# Patient Record
Sex: Female | Born: 1970
Health system: Southern US, Community
[De-identification: ages and names within clinical notes are randomized; demographics above are authoritative.]

---

## 2000-03-02 ENCOUNTER — Ambulatory Visit (HOSPITAL_COMMUNITY): Admission: RE | Admit: 2000-03-02 | Discharge: 2000-03-02 | Payer: Self-pay | Admitting: Obstetrics

## 2000-04-11 ENCOUNTER — Ambulatory Visit (HOSPITAL_COMMUNITY): Admission: RE | Admit: 2000-04-11 | Discharge: 2000-04-11 | Payer: Self-pay | Admitting: *Deleted

## 2000-05-23 ENCOUNTER — Inpatient Hospital Stay (HOSPITAL_COMMUNITY): Admission: AD | Admit: 2000-05-23 | Discharge: 2000-05-23 | Payer: Self-pay | Admitting: Gynecology

## 2000-07-07 ENCOUNTER — Inpatient Hospital Stay (HOSPITAL_COMMUNITY): Admission: AD | Admit: 2000-07-07 | Discharge: 2000-07-07 | Payer: Self-pay | Admitting: Obstetrics & Gynecology

## 2000-09-09 ENCOUNTER — Inpatient Hospital Stay (HOSPITAL_COMMUNITY): Admission: AD | Admit: 2000-09-09 | Discharge: 2000-09-09 | Payer: Self-pay | Admitting: Obstetrics & Gynecology

## 2000-09-12 ENCOUNTER — Encounter (HOSPITAL_COMMUNITY): Admission: RE | Admit: 2000-09-12 | Discharge: 2000-09-19 | Payer: Self-pay | Admitting: Obstetrics

## 2000-09-15 ENCOUNTER — Encounter: Payer: Self-pay | Admitting: Obstetrics & Gynecology

## 2000-09-15 ENCOUNTER — Inpatient Hospital Stay (HOSPITAL_COMMUNITY): Admission: AD | Admit: 2000-09-15 | Discharge: 2000-09-19 | Payer: Self-pay | Admitting: *Deleted

## 2004-07-29 ENCOUNTER — Ambulatory Visit: Payer: Self-pay | Admitting: Internal Medicine

## 2004-07-29 ENCOUNTER — Ambulatory Visit (HOSPITAL_COMMUNITY): Admission: RE | Admit: 2004-07-29 | Discharge: 2004-07-29 | Payer: Self-pay | Admitting: Internal Medicine

## 2004-07-30 ENCOUNTER — Ambulatory Visit: Payer: Self-pay | Admitting: *Deleted

## 2004-08-12 ENCOUNTER — Ambulatory Visit: Payer: Self-pay | Admitting: Family Medicine

## 2004-08-13 ENCOUNTER — Ambulatory Visit: Payer: Self-pay | Admitting: Internal Medicine

## 2004-09-03 ENCOUNTER — Ambulatory Visit: Payer: Self-pay | Admitting: Internal Medicine

## 2004-10-30 ENCOUNTER — Emergency Department (HOSPITAL_COMMUNITY): Admission: EM | Admit: 2004-10-30 | Discharge: 2004-10-30 | Payer: Self-pay | Admitting: Emergency Medicine

## 2005-01-18 ENCOUNTER — Ambulatory Visit (HOSPITAL_COMMUNITY): Admission: RE | Admit: 2005-01-18 | Discharge: 2005-01-18 | Payer: Self-pay | Admitting: *Deleted

## 2016-04-13 ENCOUNTER — Emergency Department (HOSPITAL_COMMUNITY)
Admission: EM | Admit: 2016-04-13 | Discharge: 2016-04-13 | Disposition: A | Discharge status: Home / Self Care | Payer: Self-pay | Attending: Emergency Medicine | Admitting: Emergency Medicine

## 2016-04-13 ENCOUNTER — Emergency Department (HOSPITAL_COMMUNITY): Payer: MEDICAID

## 2016-04-13 ENCOUNTER — Encounter (HOSPITAL_COMMUNITY): Payer: Self-pay | Admitting: Neurology

## 2016-04-13 DIAGNOSIS — Z3202 Encounter for pregnancy test, result negative: Secondary | ICD-10-CM | POA: Insufficient documentation

## 2016-04-13 DIAGNOSIS — N83201 Unspecified ovarian cyst, right side: Secondary | ICD-10-CM | POA: Insufficient documentation

## 2016-04-13 LAB — CBC WITH DIFFERENTIAL/PLATELET
BASOS ABS: 0 10*3/uL (ref 0.0–0.1)
BASOS PCT: 0 %
EOS ABS: 0.1 10*3/uL (ref 0.0–0.7)
Eosinophils Relative: 1 %
HEMATOCRIT: 33.6 % — AB (ref 36.0–46.0)
HEMOGLOBIN: 10.4 g/dL — AB (ref 12.0–15.0)
Lymphocytes Relative: 26 %
Lymphs Abs: 2.1 10*3/uL (ref 0.7–4.0)
MCH: 24.7 pg — ABNORMAL LOW (ref 26.0–34.0)
MCHC: 31 g/dL (ref 30.0–36.0)
MCV: 79.8 fL (ref 78.0–100.0)
Monocytes Absolute: 0.6 10*3/uL (ref 0.1–1.0)
Monocytes Relative: 7 %
NEUTROS ABS: 5.1 10*3/uL (ref 1.7–7.7)
NEUTROS PCT: 66 %
Platelets: 204 10*3/uL (ref 150–400)
RBC: 4.21 MIL/uL (ref 3.87–5.11)
RDW: 15 % (ref 11.5–15.5)
WBC: 7.8 10*3/uL (ref 4.0–10.5)

## 2016-04-13 LAB — URINALYSIS, ROUTINE W REFLEX MICROSCOPIC
BILIRUBIN URINE: NEGATIVE
GLUCOSE, UA: NEGATIVE mg/dL
Hgb urine dipstick: NEGATIVE
KETONES UR: NEGATIVE mg/dL
Leukocytes, UA: NEGATIVE
NITRITE: NEGATIVE
PROTEIN: NEGATIVE mg/dL
Specific Gravity, Urine: 1.006 (ref 1.005–1.030)
pH: 7 (ref 5.0–8.0)

## 2016-04-13 LAB — COMPREHENSIVE METABOLIC PANEL
ALBUMIN: 3.4 g/dL — AB (ref 3.5–5.0)
ALK PHOS: 64 U/L (ref 38–126)
ALT: 33 U/L (ref 14–54)
ANION GAP: 5 (ref 5–15)
AST: 35 U/L (ref 15–41)
BILIRUBIN TOTAL: 0.6 mg/dL (ref 0.3–1.2)
BUN: 6 mg/dL (ref 6–20)
CALCIUM: 8.9 mg/dL (ref 8.9–10.3)
CO2: 28 mmol/L (ref 22–32)
Chloride: 107 mmol/L (ref 101–111)
Creatinine, Ser: 0.6 mg/dL (ref 0.44–1.00)
GFR calc Af Amer: >60 mL/min (ref >60)
GFR calc non Af Amer: >60 mL/min (ref >60)
GLUCOSE: 109 mg/dL — AB (ref 65–99)
Potassium: 3.7 mmol/L (ref 3.5–5.1)
SODIUM: 140 mmol/L (ref 135–145)
TOTAL PROTEIN: 6.4 g/dL — AB (ref 6.5–8.1)

## 2016-04-13 LAB — WET PREP, GENITAL
Clue Cells Wet Prep HPF POC: NONE SEEN
SPERM: NONE SEEN
Trich, Wet Prep: NONE SEEN
YEAST WET PREP: NONE SEEN

## 2016-04-13 LAB — I-STAT BETA HCG BLOOD, ED (MC, WL, AP ONLY): I-stat hCG, quantitative: <5 m[IU]/mL (ref <5)

## 2016-04-13 LAB — LIPASE, BLOOD: Lipase: 19 U/L (ref 11–51)

## 2016-04-13 MED ORDER — ONDANSETRON HCL 4 MG/2ML IJ SOLN
4.0000 mg | Freq: Once | INTRAMUSCULAR | Status: AC
  Administered 2016-04-13: 4 mg via INTRAVENOUS
  Filled 2016-04-13: qty 2

## 2016-04-13 MED ORDER — ONDANSETRON HCL 4 MG PO TABS: 4.0000 mg | ORAL_TABLET | Freq: Four times a day (QID) | ORAL | Status: DC

## 2016-04-13 MED ORDER — IOPAMIDOL (ISOVUE-300) INJECTION 61%
INTRAVENOUS | Status: AC
Start: 2016-04-13 — End: 2016-04-13
  Administered 2016-04-13: 100 mL
  Filled 2016-04-13: qty 100

## 2016-04-13 MED ORDER — LORAZEPAM 2 MG/ML IJ SOLN
1.0000 mg | Freq: Once | INTRAMUSCULAR | Status: AC
  Administered 2016-04-13: 1 mg via INTRAVENOUS
  Filled 2016-04-13: qty 1

## 2016-04-13 MED ORDER — HYDROMORPHONE HCL 1 MG/ML IJ SOLN
1.0000 mg | Freq: Once | INTRAMUSCULAR | Status: AC
  Administered 2016-04-13: 1 mg via INTRAVENOUS
  Filled 2016-04-13: qty 1

## 2016-04-13 MED ORDER — HYDROCODONE-ACETAMINOPHEN 5-325 MG PO TABS: 1.0000 | ORAL_TABLET | ORAL | Status: DC | PRN

## 2016-04-13 MED ORDER — MORPHINE SULFATE (PF) 4 MG/ML IV SOLN
4.0000 mg | Freq: Once | INTRAVENOUS | Status: AC
  Administered 2016-04-13: 4 mg via INTRAVENOUS
  Filled 2016-04-13: qty 1

## 2016-04-13 NOTE — Discharge Instructions (Signed)
Ovarian Cyst An ovarian cyst is a fluid-filled sac that forms on an ovary. The ovaries are small organs that produce eggs in women. Various types of cysts can form on the ovaries. Most are not cancerous. Many do not cause problems, and they often go away on their own. Some may cause symptoms and require treatment. Common types of ovarian cysts include:  Functional cysts--These cysts may occur every month during the menstrual cycle. This is normal. The cysts usually go away with the next menstrual cycle if the woman does not get pregnant. Usually, there are no symptoms with a functional cyst.  Endometrioma cysts--These cysts form from the tissue that lines the uterus. They are also called "chocolate cysts" because they become filled with blood that turns brown. This type of cyst can cause pain in the lower abdomen during intercourse and with your menstrual period.  Cystadenoma cysts--This type develops from the cells on the outside of the ovary. These cysts can get very big and cause lower abdomen pain and pain with intercourse. This type of cyst can twist on itself, cut off its blood supply, and cause severe pain. It can also easily rupture and cause a lot of pain.  Dermoid cysts--This type of cyst is sometimes found in both ovaries. These cysts may contain different kinds of body tissue, such as skin, teeth, hair, or cartilage. They usually do not cause symptoms unless they get very big.  Theca lutein cysts--These cysts occur when too much of a certain hormone (human chorionic gonadotropin) is produced and overstimulates the ovaries to produce an egg. This is most common after procedures used to assist with the conception of a baby (in vitro fertilization). CAUSES   Fertility drugs can cause a condition in which multiple large cysts are formed on the ovaries. This is called ovarian hyperstimulation syndrome.  A condition called polycystic ovary syndrome can cause hormonal imbalances that can lead to  nonfunctional ovarian cysts. SIGNS AND SYMPTOMS  Many ovarian cysts do not cause symptoms. If symptoms are present, they may include:  Pelvic pain or pressure.  Pain in the lower abdomen.  Pain during sexual intercourse.  Increasing girth (swelling) of the abdomen.  Abnormal menstrual periods.  Increasing pain with menstrual periods.  Stopping having menstrual periods without being pregnant. DIAGNOSIS  These cysts are commonly found during a routine or annual pelvic exam. Tests may be ordered to find out more about the cyst. These tests may include:  Ultrasound.  X-ray of the pelvis.  CT scan.  MRI.  Blood tests. TREATMENT  Many ovarian cysts go away on their own without treatment. Your health care provider may want to check your cyst regularly for 2-3 months to see if it changes. For women in menopause, it is particularly important to monitor a cyst closely because of the higher rate of ovarian cancer in menopausal women. When treatment is needed, it may include any of the following:  A procedure to drain the cyst (aspiration). This may be done using a long needle and ultrasound. It can also be done through a laparoscopic procedure. This involves using a thin, lighted tube with a tiny camera on the end (laparoscope) inserted through a small incision.  Surgery to remove the whole cyst. This may be done using laparoscopic surgery or an open surgery involving a larger incision in the lower abdomen.  Hormone treatment or birth control pills. These methods are sometimes used to help dissolve a cyst. HOME CARE INSTRUCTIONS   Only take over-the-counter   or prescription medicines as directed by your health care provider.  Follow up with your health care provider as directed.  Get regular pelvic exams and Pap tests. SEEK MEDICAL CARE IF:   Your periods are late, irregular, or painful, or they stop.  Your pelvic pain or abdominal pain does not go away.  Your abdomen becomes  larger or swollen.  You have pressure on your bladder or trouble emptying your bladder completely.  You have pain during sexual intercourse.  You have feelings of fullness, pressure, or discomfort in your stomach.  You lose weight for no apparent reason.  You feel generally ill.  You become constipated.  You lose your appetite.  You develop acne.  You have an increase in body and facial hair.  You are gaining weight, without changing your exercise and eating habits.  You think you are pregnant. SEEK IMMEDIATE MEDICAL CARE IF:   You have increasing abdominal pain.  You feel sick to your stomach (nauseous), and you throw up (vomit).  You develop a fever that comes on suddenly.  You have abdominal pain during a bowel movement.  Your menstrual periods become heavier than usual. MAKE SURE YOU:  Understand these instructions.  Will watch your condition.  Will get help right away if you are not doing well or get worse.   This information is not intended to replace advice given to you by your health care provider. Make sure you discuss any questions you have with your health care provider.   Document Released: 11/08/2005 Document Revised: 11/13/2013 Document Reviewed: 07/16/2013 Elsevier Interactive Patient Education 2016 [employer]  

## 2016-04-13 NOTE — ED Notes (Signed)
In to discharge pt, is vomiting again. PA made aware.

## 2016-04-13 NOTE — ED Notes (Signed)
Pt is spanish speaking here with her son, reports RLQ abd pain since yesterday. Vomiting x 1 this morning.

## 2016-04-13 NOTE — ED Notes (Signed)
At discharge pt vomiting, PA made aware. Pt cleared for d/c. Needs to get zofran filled. Instructions reviewed with her son and pt. Verbalized understanding. Told to return if sx worsen or concerning sx. Taken to lobby in w/c. They were calling a friend to come get them.

## 2016-04-13 NOTE — Progress Notes (Signed)
Spoke to patients son in regards to follow up care for his mother. [employer] card application provided in spanish for mother to establish primary care. My contact information provided for any future questions or concerns. No other [facility]ligibility Specialist needs identified at this time.   Buddy DutyFelicia Evans [id number] [phone]

## 2016-04-13 NOTE — ED Provider Notes (Signed)
CSN: [id number]     Arrival date & time 04/13/16  0865 History   First MD Initiated Contact with Patient 04/13/16 0815     Chief Complaint  Patient presents with  . Abdominal Pain     (Consider location/radiation/quality/duration/timing/severity/associated sxs/prior Treatment) HPI Comments: Patient presents to the emergency department with chief complaint of right lower quadrant pain. She states that the pain started yesterday morning. It is been progressively worsening. She reports associated vomiting. Denies any diarrhea or vaginal discharge. Denies any dysuria. No known fevers. Her symptoms are worsened with palpation. Prior abdominal surgeries include previous C section.  The history is provided by the patient. No language interpreter was used.    History reviewed. No pertinent past medical history. Past Surgical History  Procedure Laterality Date  . Cesarean section     No family history on file. Social History  Substance Use Topics  . Smoking status: Never Smoker   . Smokeless tobacco: None  . Alcohol Use: No   OB History    No data available     Review of Systems  Constitutional: Negative for fever and chills.  Respiratory: Negative for shortness of breath.   Cardiovascular: Negative for chest pain.  Gastrointestinal: Positive for abdominal pain. Negative for nausea, vomiting, diarrhea and constipation.  Genitourinary: Negative for dysuria.  All other systems reviewed and are negative.     Allergies  Review of patient's allergies indicates no known allergies.  Home Medications   Prior to Admission medications   Not on File   BP 109/74 mmHg  Pulse 98  Temp(Src) 98.1 F (36.7 C) (Oral)  Resp 14  SpO2 97%  LMP 03/27/2016 Physical Exam  Constitutional: She is oriented to person, place, and time. She appears well-developed and well-nourished.  HENT:  Head: Normocephalic and atraumatic.  Eyes: Conjunctivae and EOM are normal. Pupils are equal, round, and  reactive to light.  Neck: Normal range of motion. Neck supple.  Cardiovascular: Normal rate and regular rhythm.  Exam reveals no gallop and no friction rub.   No murmur heard. Pulmonary/Chest: Effort normal and breath sounds normal. No respiratory distress. She has no wheezes. She has no rales. She exhibits no tenderness.  Abdominal: Soft. Bowel sounds are normal. She exhibits no distension and no mass. There is no tenderness. There is no rebound and no guarding.  RLQ tender to palpation, no other focal abdominal tenderness  Genitourinary:  Pelvic exam performed by PA student under my direction, chaperoned by female ER tech, no right or left adnexal tenderness, no uterine tenderness, mild white vaginal discharge, no bleeding, no CMT or friability, no foreign body, no injury to the external genitalia, no other significant findings   Musculoskeletal: Normal range of motion. She exhibits no edema or tenderness.  Neurological: She is alert and oriented to person, place, and time.  Skin: Skin is warm and dry.  Psychiatric: She has a normal mood and affect. Her behavior is normal. Judgment and thought content normal.  Nursing note and vitals reviewed.   ED Course  Procedures (including critical care time) Results for orders placed or performed during the hospital encounter of 04/13/16  Wet prep, genital  Result Value Ref Range   Yeast Wet Prep HPF POC NONE SEEN NONE SEEN   Trich, Wet Prep NONE SEEN NONE SEEN   Clue Cells Wet Prep HPF POC NONE SEEN NONE SEEN   WBC, Wet Prep HPF POC MODERATE (A) NONE SEEN   Sperm NONE SEEN   CBC with  Differential/Platelet  Result Value Ref Range   WBC 7.8 4.0 - 10.5 K/uL   RBC 4.21 3.87 - 5.11 MIL/uL   Hemoglobin 10.4 (L) 12.0 - 15.0 g/dL   HCT 95.2 (L) 84.1 - 32.4 %   MCV 79.8 78.0 - 100.0 fL   MCH 24.7 (L) 26.0 - 34.0 pg   MCHC 31.0 30.0 - 36.0 g/dL   RDW 40.1 02.7 - 25.3 %   Platelets 204 150 - 400 K/uL   Neutrophils Relative % 66 %   Neutro Abs 5.1  1.7 - 7.7 K/uL   Lymphocytes Relative 26 %   Lymphs Abs 2.1 0.7 - 4.0 K/uL   Monocytes Relative 7 %   Monocytes Absolute 0.6 0.1 - 1.0 K/uL   Eosinophils Relative 1 %   Eosinophils Absolute 0.1 0.0 - 0.7 K/uL   Basophils Relative 0 %   Basophils Absolute 0.0 0.0 - 0.1 K/uL  Comprehensive metabolic panel  Result Value Ref Range   Sodium 140 135 - 145 mmol/L   Potassium 3.7 3.5 - 5.1 mmol/L   Chloride 107 101 - 111 mmol/L   CO2 28 22 - 32 mmol/L   Glucose, Bld 109 (H) 65 - 99 mg/dL   BUN 6 6 - 20 mg/dL   Creatinine, Ser 6.64 0.44 - 1.00 mg/dL   Calcium 8.9 8.9 - 40.3 mg/dL   Total Protein 6.4 (L) 6.5 - 8.1 g/dL   Albumin 3.4 (L) 3.5 - 5.0 g/dL   AST 35 15 - 41 U/L   ALT 33 14 - 54 U/L   Alkaline Phosphatase 64 38 - 126 U/L   Total Bilirubin 0.6 0.3 - 1.2 mg/dL   GFR calc non Af Amer >60 >60 mL/min   GFR calc Af Amer >60 >60 mL/min   Anion gap 5 5 - 15  Lipase, blood  Result Value Ref Range   Lipase 19 11 - 51 U/L  Urinalysis, Routine w reflex microscopic (not at [facility])  Result Value Ref Range   Color, Urine YELLOW YELLOW   APPearance CLEAR CLEAR   Specific Gravity, Urine 1.006 1.005 - 1.030   pH 7.0 5.0 - 8.0   Glucose, UA NEGATIVE NEGATIVE mg/dL   Hgb urine dipstick NEGATIVE NEGATIVE   Bilirubin Urine NEGATIVE NEGATIVE   Ketones, ur NEGATIVE NEGATIVE mg/dL   Protein, ur NEGATIVE NEGATIVE mg/dL   Nitrite NEGATIVE NEGATIVE   Leukocytes, UA NEGATIVE NEGATIVE  I-Stat Beta hCG blood, ED ([facility], [facility], [facility] only)  Result Value Ref Range   I-stat hCG, quantitative <5.0 <5 mIU/mL   Comment 3           Ct Abdomen Pelvis W Contrast  04/13/2016  CLINICAL DATA:  RIGHT lower quadrant pain with nausea and vomiting since yesterday, negative pregnancy test EXAM: CT ABDOMEN AND PELVIS WITH CONTRAST TECHNIQUE: Multidetector CT imaging of the abdomen and pelvis was performed using the standard protocol following bolus administration of intravenous contrast. Sagittal and coronal MPR images  reconstructed from axial data set. CONTRAST:  ISOVUE-300 IOPAMIDOL (ISOVUE-300) INJECTION 61% IV. No oral contrast administered. COMPARISON:  None FINDINGS: Lower chest:  Mild bibasilar atelectasis. Hepatobiliary: Liver and gallbladder normal appearance. No biliary dilatation. Pancreas: Normal appearance Spleen: Normal appearance Adrenals/Urinary Tract: Adrenal glands normal appearance. Symmetric nephrograms with small RIGHT renal cysts noted. No additional renal mass, hydronephrosis or urinary tract calcification. Unremarkable ureters. Minimal wall thickening of the anterior bladder versus artifact from underdistention. Stomach/Bowel: Normal appendix. Stomach and bowel loops normal appearance. No  bowel dilatation or obvious wall thickening. Vascular/Lymphatic: Major vascular structures patent. No abdominal or pelvic adenopathy. Reproductive: Normal appearing uterus. Small BILATERAL ovarian cysts larger on RIGHT at 2.2 x 1.7 cm image 69. Other: No mass, hernia, free air, or free fluid. Musculoskeletal: Unremarkable IMPRESSION: No acute intra-abdominal or intrapelvic abnormalities. Small BILATERAL ovarian cysts larger on RIGHT. Electronically Signed   By: Ulyses SouthwardMark  Boles M.D.   On: 04/13/2016 10:24    I have personally reviewed and evaluated these images and lab results as part of my medical decision-making.    MDM   Final diagnoses:  Cyst of right ovary    Patient with gradually worsening right lower quadrant pain since yesterday. She reports a few episodes of vomiting. She denies any fever. Will check CT to rule out appendicitis.  I-STAT hCG is negative, negative urinalysis, no leukocytosis, patient is afebrile and vital signs are stable.  CT scan is consistent with right-sided ovarian cyst. I suspect this to be the cause of patient's pain. I doubt ovarian torsion, as the patient's pain was gradual in onset since yesterday. She has a normal appendix on CT. There is no other evidence of infection  or acute process. Will treat patient's pain, and recommend close follow-up with OB/GYN. Return precautions discussed. Patient understands and agrees the plan. She is stable and ready for discharge.    Roxy HorsemanRobert Johntay Doolen, PA-C 04/13/16 1213  Gwyneth SproutWhitney Plunkett, MD 04/13/16 [phone]

## 2016-04-14 LAB — GC/CHLAMYDIA PROBE AMP (~~LOC~~) NOT AT ARMC
CHLAMYDIA, DNA PROBE: NEGATIVE
Neisseria Gonorrhea: NEGATIVE

## 2016-04-16 ENCOUNTER — Encounter (HOSPITAL_COMMUNITY): Payer: Self-pay | Admitting: Emergency Medicine

## 2016-04-16 ENCOUNTER — Emergency Department (HOSPITAL_COMMUNITY)
Admission: EM | Admit: 2016-04-16 | Discharge: 2016-04-17 | Disposition: A | Discharge status: Home / Self Care | Payer: Self-pay | Attending: Emergency Medicine | Admitting: Emergency Medicine

## 2016-04-16 DIAGNOSIS — R112 Nausea with vomiting, unspecified: Secondary | ICD-10-CM | POA: Insufficient documentation

## 2016-04-16 DIAGNOSIS — R102 Pelvic and perineal pain: Secondary | ICD-10-CM

## 2016-04-16 DIAGNOSIS — Z3202 Encounter for pregnancy test, result negative: Secondary | ICD-10-CM | POA: Insufficient documentation

## 2016-04-16 DIAGNOSIS — R630 Anorexia: Secondary | ICD-10-CM | POA: Insufficient documentation

## 2016-04-16 DIAGNOSIS — N83201 Unspecified ovarian cyst, right side: Secondary | ICD-10-CM | POA: Insufficient documentation

## 2016-04-16 DIAGNOSIS — K5900 Constipation, unspecified: Secondary | ICD-10-CM | POA: Insufficient documentation

## 2016-04-16 DIAGNOSIS — Z9049 Acquired absence of other specified parts of digestive tract: Secondary | ICD-10-CM | POA: Insufficient documentation

## 2016-04-16 LAB — COMPREHENSIVE METABOLIC PANEL
ALBUMIN: 3.6 g/dL (ref 3.5–5.0)
ALK PHOS: 61 U/L (ref 38–126)
ALT: 43 U/L (ref 14–54)
ANION GAP: 6 (ref 5–15)
AST: 33 U/L (ref 15–41)
BILIRUBIN TOTAL: 0.5 mg/dL (ref 0.3–1.2)
BUN: 9 mg/dL (ref 6–20)
CALCIUM: 9.1 mg/dL (ref 8.9–10.3)
CO2: 26 mmol/L (ref 22–32)
Chloride: 106 mmol/L (ref 101–111)
Creatinine, Ser: 0.67 mg/dL (ref 0.44–1.00)
GLUCOSE: 114 mg/dL — AB (ref 65–99)
POTASSIUM: 3.9 mmol/L (ref 3.5–5.1)
Sodium: 138 mmol/L (ref 135–145)
TOTAL PROTEIN: 7.1 g/dL (ref 6.5–8.1)

## 2016-04-16 LAB — URINALYSIS, ROUTINE W REFLEX MICROSCOPIC
BILIRUBIN URINE: NEGATIVE
Glucose, UA: NEGATIVE mg/dL
Hgb urine dipstick: NEGATIVE
Ketones, ur: NEGATIVE mg/dL
LEUKOCYTES UA: NEGATIVE
NITRITE: NEGATIVE
PH: 6 (ref 5.0–8.0)
Protein, ur: NEGATIVE mg/dL
SPECIFIC GRAVITY, URINE: 1.01 (ref 1.005–1.030)

## 2016-04-16 LAB — CBC
HEMATOCRIT: 33 % — AB (ref 36.0–46.0)
HEMOGLOBIN: 10.3 g/dL — AB (ref 12.0–15.0)
MCH: 24.2 pg — ABNORMAL LOW (ref 26.0–34.0)
MCHC: 31.2 g/dL (ref 30.0–36.0)
MCV: 77.6 fL — ABNORMAL LOW (ref 78.0–100.0)
Platelets: 244 10*3/uL (ref 150–400)
RBC: 4.25 MIL/uL (ref 3.87–5.11)
RDW: 14.7 % (ref 11.5–15.5)
WBC: 6.5 10*3/uL (ref 4.0–10.5)

## 2016-04-16 LAB — POC URINE PREG, ED: PREG TEST UR: NEGATIVE

## 2016-04-16 LAB — LIPASE, BLOOD: Lipase: 15 U/L (ref 11–51)

## 2016-04-16 MED ORDER — MORPHINE SULFATE (PF) 4 MG/ML IV SOLN
4.0000 mg | Freq: Once | INTRAVENOUS | Status: AC
  Administered 2016-04-16: 4 mg via INTRAVENOUS
  Filled 2016-04-16: qty 1

## 2016-04-16 MED ORDER — ONDANSETRON HCL 4 MG/2ML IJ SOLN
4.0000 mg | Freq: Once | INTRAMUSCULAR | Status: AC
  Administered 2016-04-16: 4 mg via INTRAVENOUS
  Filled 2016-04-16: qty 2

## 2016-04-16 NOTE — ED Notes (Signed)
In to room by w/c, alert, NAD, calm, no dyspnea noted.

## 2016-04-16 NOTE — ED Provider Notes (Signed)
CSN: [id number]     Arrival date & time 04/16/16  1912 History  By signing my name below, I, Ronney Lion, attest that this documentation has been prepared under the direction and in the presence of Derwood Kaplan, MD. Electronically Signed: Ronney Lion, ED Scribe. 04/16/2016. 3:25 AM.    Chief Complaint  Patient presents with  . Abdominal Pain   The history is provided by the patient and medical records. A language interpreter was used (Patient's son acted as Equities trader.).    HPI Comments: Jenna Wolfe is a 45 y.o. female with a history of Cesarean section (no other abdominal surgeries, per pt), who presents to the Emergency Department complaining constant, waxing and waning episodes of RLQ abdominal pain that intermittently peak in severity for 2 minutes at a time, with onset 4 days ago. Patient characterizes the pain as "like a needle poking her" in quality and states it radiates down to her right thigh. Patient also complains of associated nausea with "too many" episodes of vomiting this morning, loss of appetite, and constipation for the past 4 days. She states she has been passing gas. She reports she sometimes walks with an antalgic gait, secondary to her abdominal pain. Per chart review, patient was evaluated in the ED for the RLQ pain 3 days ago, on 04/13/16. A CT abdomen was done at that time and showed a normal appendix and "Small BILATERAL ovarian cysts, larger on RIGHT at 2.2 x 1.7 cm." Patient states this is a new problem. She denies a history of DM. Patient reports a history of Cesarean section but otherwise denies a history of abdominal surgeries. She denies diarrhea or dysuria.  No past medical history on file. Past Surgical History  Procedure Laterality Date  . Cesarean section     No family history on file. Social History  Substance Use Topics  . Smoking status: Never Smoker   . Smokeless tobacco: Not on file  . Alcohol Use: No   OB History    No data available      Review of Systems  Constitutional: Positive for appetite change.  Gastrointestinal: Positive for nausea, vomiting, abdominal pain and constipation. Negative for diarrhea.  Genitourinary: Negative for dysuria.   Allergies  Review of patient's allergies indicates no known allergies.  Home Medications   Prior to Admission medications   Medication Sig Start Date End Date Taking? Authorizing Provider  HYDROcodone-acetaminophen (NORCO/VICODIN) 5-325 MG tablet Take 1-2 tablets by mouth every 4 (four) hours as needed. 04/17/16   Derwood Kaplan, MD  ondansetron (ZOFRAN) 4 MG tablet Take 1 tablet (4 mg total) by mouth every 6 (six) hours. 04/17/16   Cauy Melody, MD   BP 105/68 mmHg  Pulse 75  Temp(Src) 98 F (36.7 C) (Oral)  Resp 18  Wt 130 lb (58.968 kg)  SpO2 99%  LMP 03/27/2016 Physical Exam  Constitutional: She is oriented to person, place, and time. She appears well-developed and well-nourished. No distress.  HENT:  Head: Normocephalic and atraumatic.  Eyes: Conjunctivae and EOM are normal.  Neck: Neck supple. No tracheal deviation present.  Cardiovascular: Normal rate and regular rhythm.   Pulmonary/Chest: Effort normal and breath sounds normal. No respiratory distress. She has no wheezes. She has no rales.  Lungs are clear to auscultation.   Abdominal: There is tenderness. There is tenderness at McBurney's point.  Positive RLQ tenderness. Positive McBurney's.   Musculoskeletal: Normal range of motion.  Neurological: She is alert and oriented to person, place, and time.  Skin:  Skin is warm and dry.  Psychiatric: She has a normal mood and affect. Her behavior is normal.  Nursing note and vitals reviewed.   ED Course  Procedures (including critical care time)  DIAGNOSTIC STUDIES: Oxygen Saturation is 100% on RA, normal by my interpretation.    COORDINATION OF CARE: 11:34 PM - Discussed treatment plan with pt and her son at bedside which includes lab tests, including  ultrasound and pain medications administered here. If ultrasound is unremarkable, will proceed with pelvic exam. Pt and her son verbalized understanding and agreed to plan.   3:23 AM - Reassessed pt. We have reviewed lab and imaging results with pt, via Spanish interpreter. Informed pt that she needs to see a gynecologist as soon as possible. Strict ER return precautions have been discussed for worsening symptoms.   Labs Review Labs Reviewed  COMPREHENSIVE METABOLIC PANEL - Abnormal; Notable for the following:    Glucose, Bld 114 (*)    All other components within normal limits  CBC - Abnormal; Notable for the following:    Hemoglobin 10.3 (*)    HCT 33.0 (*)    MCV 77.6 (*)    MCH 24.2 (*)    All other components within normal limits  LIPASE, BLOOD  URINALYSIS, ROUTINE W REFLEX MICROSCOPIC (NOT AT [facility])  POC URINE PREG, ED    Imaging Review Koreas Transvaginal Non-ob  04/17/2016  CLINICAL DATA:  Right lower quadrant pain for 4 days, progressive. EXAM: TRANSABDOMINAL AND TRANSVAGINAL ULTRASOUND OF PELVIS DOPPLER ULTRASOUND OF OVARIES TECHNIQUE: Both transabdominal and transvaginal ultrasound examinations of the pelvis were performed. Transabdominal technique was performed for global imaging of the pelvis including uterus, ovaries, adnexal regions, and pelvic cul-de-sac. It was necessary to proceed with endovaginal exam following the transabdominal exam to visualize the right ovary. Color and duplex Doppler ultrasound was utilized to evaluate blood flow to the ovaries. COMPARISON:  CT abdomen/ pelvis 04/13/2016 FINDINGS: Uterus Measurements: 10.9 x 4.1 x 5.2 cm. No fibroids or other mass visualized. Endometrium Thickness: 16 mm. No focal abnormality visualized, however appears mildly heterogeneous. Right ovary Measurements: 2.7 x 2.5 x 3.0 cm. There is a complex 2.1 x 2.1 x 2.2 cm cyst. This as lacy low-level internal echoes. Blood flow seen to the ovarian parenchyma. Left ovary Measurements: 1.6 x  2.4 x 1.7 cm. Follicular cyst measures 2.1 x 1.1 x 1.2 cm. No adnexal mass. Normal blood flow. Pulsed Doppler evaluation of both ovaries demonstrates normal low-resistance arterial and venous waveforms. Other findings Trace free fluid, physiologic. IMPRESSION: 1. Complex cyst in the right ovary measuring 2.1 cm has the appearance of a hemorrhagic cyst. No torsion, normal blood flow is seen. 2. Simple follicular cyst in the left ovary. 3. Upper normal endometrial thickness measuring 16 mm, with heterogeneity. This may be normal for phase of menstrual cycle. However, if there is a history of abnormal vaginal bleeding that remains unresponsive to hormonal or medical therapy, focal lesion work-up with sonohysterogram should be considered. Endometrial biopsy should also be considered in pre-menopausal patients at high risk for endometrial carcinoma. (Ref: Radiological Reasoning: Algorithmic Workup of Abnormal Vaginal Bleeding with Endovaginal Sonography and Sonohysterography. AJR 2008; 308:M57-84; 191:[id number]) Electronically Signed   By: Rubye OaksMelanie  Ehinger M.D.   On: 04/17/2016 01:24   [place] Pelvis Complete  04/17/2016  CLINICAL DATA:  Right lower quadrant pain for 4 days, progressive. EXAM: TRANSABDOMINAL AND TRANSVAGINAL ULTRASOUND OF PELVIS DOPPLER ULTRASOUND OF OVARIES TECHNIQUE: Both transabdominal and transvaginal ultrasound examinations of the pelvis were performed. Transabdominal  technique was performed for global imaging of the pelvis including uterus, ovaries, adnexal regions, and pelvic cul-de-sac. It was necessary to proceed with endovaginal exam following the transabdominal exam to visualize the right ovary. Color and duplex Doppler ultrasound was utilized to evaluate blood flow to the ovaries. COMPARISON:  CT abdomen/ pelvis 04/13/2016 FINDINGS: Uterus Measurements: 10.9 x 4.1 x 5.2 cm. No fibroids or other mass visualized. Endometrium Thickness: 16 mm. No focal abnormality visualized, however appears mildly  heterogeneous. Right ovary Measurements: 2.7 x 2.5 x 3.0 cm. There is a complex 2.1 x 2.1 x 2.2 cm cyst. This as lacy low-level internal echoes. Blood flow seen to the ovarian parenchyma. Left ovary Measurements: 1.6 x 2.4 x 1.7 cm. Follicular cyst measures 2.1 x 1.1 x 1.2 cm. No adnexal mass. Normal blood flow. Pulsed Doppler evaluation of both ovaries demonstrates normal low-resistance arterial and venous waveforms. Other findings Trace free fluid, physiologic. IMPRESSION: 1. Complex cyst in the right ovary measuring 2.1 cm has the appearance of a hemorrhagic cyst. No torsion, normal blood flow is seen. 2. Simple follicular cyst in the left ovary. 3. Upper normal endometrial thickness measuring 16 mm, with heterogeneity. This may be normal for phase of menstrual cycle. However, if there is a history of abnormal vaginal bleeding that remains unresponsive to hormonal or medical therapy, focal lesion work-up with sonohysterogram should be considered. Endometrial biopsy should also be considered in pre-menopausal patients at high risk for endometrial carcinoma. (Ref: Radiological Reasoning: Algorithmic Workup of Abnormal Vaginal Bleeding with Endovaginal Sonography and Sonohysterography. AJR 2008; 960:A54-09) Electronically Signed   By: Rubye Oaks M.D.   On: 04/17/2016 01:24   [place] Art/ven Flow Abd Pelv Doppler  04/17/2016  CLINICAL DATA:  Right lower quadrant pain for 4 days, progressive. EXAM: TRANSABDOMINAL AND TRANSVAGINAL ULTRASOUND OF PELVIS DOPPLER ULTRASOUND OF OVARIES TECHNIQUE: Both transabdominal and transvaginal ultrasound examinations of the pelvis were performed. Transabdominal technique was performed for global imaging of the pelvis including uterus, ovaries, adnexal regions, and pelvic cul-de-sac. It was necessary to proceed with endovaginal exam following the transabdominal exam to visualize the right ovary. Color and duplex Doppler ultrasound was utilized to evaluate blood flow to the ovaries.  COMPARISON:  CT abdomen/ pelvis 04/13/2016 FINDINGS: Uterus Measurements: 10.9 x 4.1 x 5.2 cm. No fibroids or other mass visualized. Endometrium Thickness: 16 mm. No focal abnormality visualized, however appears mildly heterogeneous. Right ovary Measurements: 2.7 x 2.5 x 3.0 cm. There is a complex 2.1 x 2.1 x 2.2 cm cyst. This as lacy low-level internal echoes. Blood flow seen to the ovarian parenchyma. Left ovary Measurements: 1.6 x 2.4 x 1.7 cm. Follicular cyst measures 2.1 x 1.1 x 1.2 cm. No adnexal mass. Normal blood flow. Pulsed Doppler evaluation of both ovaries demonstrates normal low-resistance arterial and venous waveforms. Other findings Trace free fluid, physiologic. IMPRESSION: 1. Complex cyst in the right ovary measuring 2.1 cm has the appearance of a hemorrhagic cyst. No torsion, normal blood flow is seen. 2. Simple follicular cyst in the left ovary. 3. Upper normal endometrial thickness measuring 16 mm, with heterogeneity. This may be normal for phase of menstrual cycle. However, if there is a history of abnormal vaginal bleeding that remains unresponsive to hormonal or medical therapy, focal lesion work-up with sonohysterogram should be considered. Endometrial biopsy should also be considered in pre-menopausal patients at high risk for endometrial carcinoma. (Ref: Radiological Reasoning: Algorithmic Workup of Abnormal Vaginal Bleeding with Endovaginal Sonography and Sonohysterography. AJR 2008; [id number]:[id number]) Electronically Signed  By: Rubye Oaks M.D.   On: 04/17/2016 01:24   I have personally reviewed and evaluated these images and lab results as part of my medical decision-making.  MDM   Final diagnoses:  Pelvic pain in female  Cyst of right ovary    I personally performed the services described in this documentation, which was scribed in my presence. The recorded information has been reviewed and is accurate.  Pt comes in with RLQ abd pain. Pain present for a week now. She had a  CT scan done few days back, and it was neg for appendicitis. Small ovarian cyst seen. Pt has hx of csection, no other significant surgical hx. She has no significant medical hx either.  Pt has some nausea with the pain. Her pain is constant at baseline, with waxing and waning. No specific aggravating factors. On exam, VSS and WNL. RLQ tenderness, focal. Pt denies any uti like sx, vaginal bleeding or discharge. US pelvic ordered - r/o torsion, abscess, free fluid. Ovary is complex. Utilized a Nurse, learning disability to discuss the ultrasound findings, specfically the complex cyst. Strongly advised to see Gyne, and pt has no Gyne, so we have provided [facility], so that pt's uterine abnormality can also be addressed. Currently, no night sweats, significant weight loss. All the labs are reassuring. We dont feel CT is indicated in the Er for this visit given the neg labs, and neg recent CT.    Derwood Kaplan, MD 04/17/16 2348

## 2016-04-16 NOTE — ED Notes (Signed)
EDP into room, at BS.  ?

## 2016-04-16 NOTE — ED Notes (Signed)
Pt here with RLQ abd pain. Pt was seen here for same on Tuesday and told to return if worsening. Pt also reports continued nausea and vomiting. Denies diarrhea.

## 2016-04-17 ENCOUNTER — Emergency Department (HOSPITAL_COMMUNITY): Payer: MEDICAID

## 2016-04-17 MED ORDER — HYDROCODONE-ACETAMINOPHEN 5-325 MG PO TABS: 1.0000 | ORAL_TABLET | ORAL | Status: DC | PRN

## 2016-04-17 MED ORDER — ONDANSETRON HCL 4 MG PO TABS: 4.0000 mg | ORAL_TABLET | Freq: Four times a day (QID) | ORAL | Status: DC

## 2016-04-17 NOTE — ED Notes (Signed)
Not in room, remains in US.

## 2016-04-17 NOTE — Discharge Instructions (Signed)
We saw you in the ER for the abdominal pain. All of our results are normal, including all labs and imaging. Kidney function is fine as well. We are not sure what is causing your abdominal pain, and recommend that you see your primary care doctor within 2-3 days for further evaluation. If your symptoms get worse, return to the ER. Take the pain meds and nausea meds as prescribed.   Dolor abdominal en adultos (Abdominal Pain, Adult) El dolor puede tener muchas causas. Normalmente la causa del dolor abdominal no es una enfermedad y Scientist, clinical (histocompatibility and immunogenetics)mejorar sin TEFL teachertratamiento. Frecuentemente puede controlarse y tratarse en casa. Su mdico le Medical sales representativerealizar un examen fsico y posiblemente solicite anlisis de sangre y radiografas para ayudar a Chief Strategy Officerdeterminar la gravedad de su dolor. Sin embargo, en IAC/InterActiveCorpmuchos casos, debe transcurrir ms tiempo antes de que se pueda Clinical research associateencontrar una causa evidente del dolor. Antes de llegar a ese punto, es posible que su mdico no sepa si necesita ms pruebas o un tratamiento ms profundo. INSTRUCCIONES PARA EL CUIDADO EN EL HOGAR  Est atento al dolor para ver si hay cambios. Las siguientes indicaciones ayudarn a Architectural technologistaliviar cualquier molestia que pueda sentir:  [place] solo medicamentos de venta libre o recetados, segn las indicaciones del mdico.  No tome laxantes a menos que se lo haya indicado su mdico.  Pruebe con Neomia Dearuna dieta lquida absoluta (caldo, t o agua) segn se lo indique su mdico. Introduzca gradualmente una dieta normal, segn su tolerancia. SOLICITE ATENCIN MDICA SI:  Tiene dolor abdominal sin explicacin.  Tiene dolor abdominal relacionado con nuseas o diarrea.  Tiene dolor cuando orina o defeca.  Experimenta dolor abdominal que lo despierta de noche.  Tiene dolor abdominal que empeora o mejora cuando come alimentos.  Tiene dolor abdominal que empeora cuando come alimentos grasosos.  Tiene fiebre. SOLICITE ATENCIN MDICA DE INMEDIATO SI:   El dolor no desaparece en un  plazo mximo de 2horas.  No deja de (vomitar).  El Engineer, miningdolor se siente solo en partes del abdomen, como el lado derecho o la parte inferior izquierda del abdomen.  Evaca materia fecal sanguinolenta o negra, de aspecto alquitranado. ASEGRESE DE QUE:  Comprende estas instrucciones.  Controlar su afeccin.  Recibir ayuda de inmediato si no mejora o si empeora.   Esta informacin no tiene Theme park managercomo fin reemplazar el consejo del mdico. Asegrese de hacerle al mdico cualquier pregunta que tenga.   Document Released: 11/08/2005 Document Revised: 11/29/2014 Elsevier Interactive Patient Education 2016 [employer]  Dolor plvico, mujeres (Pelvic Pain, Female)  Las causa del dolor plvico en la mujer pueden ser muchas y pueden tener su origen en diferentes lugares. El dolor plvico es el que aparece en la mitad inferior del abdomen y [place] Samos[place]entre las caderas. Puede aparecer durante en un perodo corto de tiempo (agudo)o puede ser recurrente (crnico). Esta afeccin puede estar relacionada con trastornos que afectan a los rganos reproductivos femeninos (ginecolgica), pero tambin puede deberse a problemas en la vejiga, clculos renales, complicaciones intestinales, o problemas musculares o esquelticos. Es Garment/textile technologistimportante solicitar ayuda de inmediato, sobre todo si ha sido intenso, [place]udo, o ha aparecido de Wellsite geologistmanera sbita como un dolor inusual. Tambin es importante obtener ayuda de inmediato, ya que algunos tipos de dolor plvico puede poner en peligro la vida.  CAUSAS  A continuacin veremos algunas de las causas del dolor plvico. Las causas pueden clasificarse de diferentes modos.   Ginecolgica.  Enfermedad inflamatoria plvica.  Infecciones de transmisin sexual.  Quiste de ovario o torsin de un ligamento  ovrico ( torsin ovrica).  La membrana que recubre internamente al tero desarrollndose fuera del tero (endometriosis).  Fibromas, quistes o tumores.  Ovulacin.  Embarazo.  Embarazo  fuera del tero (embarazo ectpico).  Aborto espontneo.  Trabajo de [place].  Desprendimiento de la placenta o ruptura del tero.  Infecciones.  Infeccin uterina (endometritis).  Infeccin de la vejiga.  Diverticulitis.  Aborto relacionado con una infeccin uterina (aborto sptico).  Vejiga.  Inflamacin de la vejiga (cistitis).  Clculos renales.  Gastrointenstinal.  Estreimiento.  Diverticulitis.  Neurolgico.  Traumatismos.  Sentir dolor plvico debido a causas mentales o emocionales (psicosomtico).  Tumores en el intestino o en la pelvis. EVALUACIN  El mdico har una historia clnica detallada segn sus sntomas. Incluir los cambios recientes en su salud, una cuidadosa historia ginecolgica de sus periodos (menstruaciones) y Neomia Dear historia de su actividad sexual. Los antecedentes familiares y la historia clnica tambin son importantes. Su mdico podr indicar un examen plvico. El examen plvico ayudar a identificar la ubicacin y la gravedad del Engineer, mining. Tambin ayudar a [employer] rganos que pueden estar involucrados. Myrtha Mantis identificar la causa del dolor plvico y tratarlo adecuadamente, el mdico puede indicar estudios. Estas pruebas pueden ser:   Test de embarazo.  Ecografa plvica.  Radiografa del abdomen.  Un anlisis de [place] o la evaluacin de la secrecin vaginal.  Anlisis de [place]. INSTRUCCIONES PARA EL CUIDADO EN EL HOGAR   Solo tome medicamentos de venta libre o recetados para Chief Technology Officer, Dentist o fiebre, segn las indicaciones del mdico.   Haga reposo segn las indicaciones del mdico.   Consuma una dieta balanceada.   Beba gran cantidad de lquido para mantener la orina de tono claro o amarillo plido.   Evite las relaciones sexuales, Counsellor.   Aplique compresas calientes o fras en la zona baja del abdomen segn cual le calme el dolor.   Evite las situaciones estresantes.   Lleve un registro del dolor  plvico. Anote cundo comenz, dnde se Optometrist y si hay cosas que parecen estar asociadas con el dolor, como algn alimento o su ciclo menstrual.  Concurra a las visitas de control con el mdico, segn las indicaciones.  SOLICITE ATENCIN MDICA SI:   Los medicamentos no Editor, commissioning.  Tiene flujo vaginal anormal. SOLICITE ATENCIN MDICA DE INMEDIATO SI:   Tiene un sangrado abundante por la vagina.   El dolor plvico aumenta.   Se siente mareada o sufre un desmayo.   Siente escalofros.   Siente dolor intenso al Geographical information systems officer u observa sangre en la orina.   Tiene diarrea o vmitos que no puede controlar.   Tiene fiebre o sntomas que persisten durante ms de [address]  [address].  Tiene fiebre y los sntomas [address].   Ha sido abusada fsica o sexualmente.  ASEGRESE DE QUE:   Comprende estas instrucciones.  Controlar su enfermedad.  Solicitar ayuda de inmediato si no mejora o si empeora.   Esta informacin no tiene Theme park manager el consejo del mdico. Asegrese de hacerle al mdico cualquier pregunta que tenga.   Document Released: 02/04/2009 Document Revised: 03/25/2015 Elsevier Interactive Patient Education [employer]

## 2016-04-17 NOTE — ED Notes (Signed)
Resting quietly, NAD, calm, c/o "a little pain and a little nausea", rates pain 8/10[facility]

## 2016-04-17 NOTE — ED Notes (Signed)
To US, no changes.

## 2016-04-28 ENCOUNTER — Ambulatory Visit: Payer: Self-pay | Attending: Internal Medicine

## 2016-05-03 ENCOUNTER — Ambulatory Visit: Payer: Self-pay | Attending: Internal Medicine | Admitting: Internal Medicine

## 2016-05-03 ENCOUNTER — Encounter: Payer: Self-pay | Admitting: Internal Medicine

## 2016-05-03 VITALS — BP 101/72 | HR 97 | Temp 98.1°F | Resp 16 | Ht 59.75 in | Wt 133.0 lb

## 2016-05-03 DIAGNOSIS — Z131 Encounter for screening for diabetes mellitus: Secondary | ICD-10-CM

## 2016-05-03 DIAGNOSIS — Z79899 Other long term (current) drug therapy: Secondary | ICD-10-CM | POA: Insufficient documentation

## 2016-05-03 DIAGNOSIS — N83201 Unspecified ovarian cyst, right side: Secondary | ICD-10-CM

## 2016-05-03 DIAGNOSIS — N83202 Unspecified ovarian cyst, left side: Secondary | ICD-10-CM

## 2016-05-03 DIAGNOSIS — D649 Anemia, unspecified: Secondary | ICD-10-CM

## 2016-05-03 MED ORDER — TRAMADOL HCL 50 MG PO TABS: 50.0000 mg | ORAL_TABLET | Freq: Three times a day (TID) | ORAL | Status: DC | PRN

## 2016-05-03 NOTE — Patient Instructions (Signed)
Anemia inespecfica (Anemia, Nonspecific) La anemia es una enfermedad en la que la concentracin de glbulos rojos o el nivel de hemoglobina en la sangre estn por debajo de lo normal. La hemoglobina es la sustancia de los glbulos rojos que lleva el oxgeno a todo el cuerpo. La anemia da como resultado que los tejidos no reciban la cantidad suficiente de oxgeno.  CAUSAS  Las causas ms frecuentes de anemia son:   Sangrado excesivo. El sangrado puede ser interno o externo. Incluye sangrado excesivo debido al perodo (en las mujeres) o por los intestinos.   Dficit nutricional.   Enfermedad renal, tiroidea o heptica crnicas.  Enfermedades de la mdula sea que disminuyen la produccin de glbulos rojos.  Cncer y tratamientos para el cncer.  VIH, sida y sus tratamientos.  Trastornos del bazo que aumentan la destruccin de glbulos rojos.  Enfermedades de la sangre.  Destruccin excesiva de glbulos rojos debido a una infeccin, a medicamentos y a enfermedades autoinmunes. SIGNOS Y SNTOMAS   Debilidad leve.   Mareos.   Dolor de cabeza.  Palpitaciones.   Falta de aire, especialmente con el ejercicio.   Palidez.  Sensibilidad al fro.  Indigestin.  Nuseas.  Dificultad para dormir.  Dificultad para concentrarse. Los sntomas pueden ocurrir repentinamente o pueden desarrollarse lentamente.  DIAGNSTICO  Con frecuencia es necesario realizar anlisis de sangre adicionales. Estos ayudan al profesional a determinar el mejor tratamiento. Su mdico controlar la materia fecal para detectar la presencia de sangre y buscar otras causas de prdida de sangre.  TRATAMIENTO  El tratamiento vara segn la causa de la anemia. Las opciones de tratamiento son:   Suplementos de hierro, vitamina B12, o cido flico.   Medicamentos con hormonas.   Transfusin de sangre. Ser necesaria en los casos de prdida de sangre grave.   Hospitalizacin. Ser necesaria si la  prdida de sangre es continua y significativa.   Cambios en la dieta.  Extirpacin del bazo. INSTRUCCIONES PARA EL CUIDADO EN EL HOGAR Cumpla con todas las visitas de control. Generalmente demora varias semanas corregir la anemia, y es muy importante que el mdico controle su enfermedad y su respuesta al tratamiento. SOLICITE ATENCIN MDICA DE INMEDIATO SI:   Siente debilidad extrema, falta de aire o dolor en el pecho.   Se siente mareado o tiene dificultad para concentrarse.  Tiene una hemorragia vaginal abundante.   Aparece una erupcin cutnea.   La materia fecal es negra, de aspecto alquitranado.   Se desmaya.   Vomita sangre.   Vomita repetidas veces.   Siente dolor abdominal.  Tiene fiebre o sntomas persistentes durante ms de 2 - 3 das.   Tiene fiebre y los sntomas empeoran repentinamente.   Se deshidrata.  ASEGRESE DE QUE:  Comprende estas instrucciones.  Controlar su afeccin.  Recibir ayuda de inmediato si no mejora o si empeora.   Esta informacin no tiene como fin reemplazar el consejo del mdico. Asegrese de hacerle al mdico cualquier pregunta que tenga.   Document Released: 11/08/2005 Document Revised: 07/11/2013 Elsevier Interactive Patient Education 2016 Elsevier Inc.  

## 2016-05-03 NOTE — Progress Notes (Signed)
Pt here for  ED F/U for cyst. Pt reports pain in right lower abdomen where cyst is located. Pain rated at a 3 described as burning. Pain has been present since she went to the ED. Pain just started back today. Patient was prescribed a hydrocodone for the pain and she reports it has been helping.

## 2016-05-03 NOTE — Progress Notes (Signed)
Jenna Wolfe, is a 45 y.o. female  UJW:[id number]  FAO:[id number]  DOB - 07/14/1971  CC:  Chief Complaint  Patient presents with  . Follow-up    cyst        HPI: Jenna Wolfe is a 45 y.o. female here today to establish medical care., w/ recent ED evaluation 04/16/16 for rlq pain, found to have right complex ovarian cyst.  Was on norco for pain, but ran out.  Now still c/o of intermittant rlq pain., currently denies pain to me.  Denies smoking/drinking or any other medical history.   Patient has No headache, No chest pain, No Nausea/emesis/diarrhea/hematochezia, No new weakness tingling or numbness, No Cough - SOB.  Spanish interpreter via ipad used throughout history and exam.  No Known Allergies History reviewed. No pertinent past medical history. Current Outpatient Prescriptions on File Prior to Visit  Medication Sig Dispense Refill  . HYDROcodone-acetaminophen (NORCO/VICODIN) 5-325 MG tablet Take 1-2 tablets by mouth every 4 (four) hours as needed. 10 tablet 0  . ondansetron (ZOFRAN) 4 MG tablet Take 1 tablet (4 mg total) by mouth every 6 (six) hours. 12 tablet 0   No current facility-administered medications on file prior to visit.   History reviewed. No pertinent family history. Social History   Social History  . Marital Status: Single    Spouse Name: N/A  . Number of Children: N/A  . Years of Education: N/A   Occupational History  . Not on file.   Social History Main Topics  . Smoking status: Never Smoker   . Smokeless tobacco: Not on file  . Alcohol Use: No  . Drug Use: No  . Sexual Activity: Not on file   Other Topics Concern  . Not on file   Social History Narrative    Review of Systems: Per hpi, otherwise all systems reviewed negative.   Objective:   Filed Vitals:   05/03/16 1640  BP: 101/72  Pulse: 97  Temp: 98.1 F (36.7 C)  Resp: 16    Filed Weights   05/03/16 1636  Weight: 133 lb (60.328 kg)    BP Readings  from Last 3 Encounters:  05/03/16 101/72  04/17/16 105/68  04/13/16 112/71    Physical Exam: Constitutional: Patient appears well-developed and well-nourished. No distress. AAOx3, obese. HENT: Normocephalic, atraumatic, External right and left ear normal. Oropharynx is clear and moist.  Eyes: Conjunctivae and EOM are normal. PERRL, no scleral icterus. Neck: Normal ROM. Neck supple.  CVS: RRR, S1/S2 +, no murmurs, no gallops, no carotid bruit.  Pulmonary: Effort and breath sounds normal, no  W/c/r Abdominal: Soft. BS +, no distension, Mild ttp to RLQ on DEEP palpation, no g/r.  No cva tenderness. Musculoskeletal: Normal range of motion. No edema and no tenderness.  LE: bilat/ no c/c/e, pulses 2+ bilateral. Neuro: Alert. muscle tone coordination normal,. No cranial nerve deficit grossly. Skin: Skin is warm and dry. No rash noted. Not diaphoretic. No erythema. No pallor. Psychiatric: Normal mood and affect. Behavior, judgment, thought content normal.  Lab Results  Component Value Date   WBC 6.5 04/16/2016   HGB 10.3* 04/16/2016   HCT 33.0* 04/16/2016   MCV 77.6* 04/16/2016   PLT 244 04/16/2016   Lab Results  Component Value Date   CREATININE 0.67 04/16/2016   BUN 9 04/16/2016   NA 138 04/16/2016   K 3.9 04/16/2016   CL 106 04/16/2016   CO2 26 04/16/2016    No results found for: HGBA1C Lipid Panel  No  results found for: CHOL, TRIG, HDL, CHOLHDL, VLDL, LDLCALC     Depression screen [facility] 05/03/2016  Decreased Interest 0  Down, Depressed, Hopeless 0  PHQ - 2 Score 0   pelivs us 04/15/16 Right complex ovarian cyst 2.7x2.5x3cm; left cyst 1.6x2.4x1.7 cm.  Assessment and plan:   1. Cysts of both ovaries, right >L, w/ ttp on right - ultram 50mg  tabs, #30, prn until sees ob. - Ambulatory referral to Obstetrics / Gynecology  2. DM (diabetes mellitus screen) - Hemoglobin A1c  3. Anemia, unspecified anemia type, still having menses per pt. - Iron, TIBC and Ferritin  Panel   Return in about 3 months (around 08/03/2016), or if symptoms worsen or fail to improve.  The patient was given clear instructions to go to ER or return to medical center if symptoms don't improve, worsen or new problems develop. The patient verbalized understanding. The patient was told to call to get lab results if they haven't heard anything in the next week.      Pete Glatterawn T Aneka Fagerstrom, MD, MBA/MHA [facility] [place], [place] [phone]   05/03/2016, 5:59 PM

## 2016-05-04 ENCOUNTER — Other Ambulatory Visit: Payer: Self-pay | Admitting: Internal Medicine

## 2016-05-04 LAB — IRON,TIBC AND FERRITIN PANEL
%SAT: 5 % — AB (ref 11–50)
FERRITIN: 5 ng/mL — AB (ref 10–232)
IRON: 20 ug/dL — AB (ref 40–190)
TIBC: 438 ug/dL (ref 250–450)

## 2016-05-04 LAB — HEMOGLOBIN A1C
HEMOGLOBIN A1C: 6.3 % — AB (ref <5.7)
Mean Plasma Glucose: 134 mg/dL

## 2016-05-04 MED ORDER — FERROUS GLUCONATE 324 (38 FE) MG PO TABS: 324.0000 mg | ORAL_TABLET | Freq: Two times a day (BID) | ORAL | Status: DC

## 2016-05-04 MED ORDER — METFORMIN HCL 500 MG PO TABS: 500.0000 mg | ORAL_TABLET | Freq: Two times a day (BID) | ORAL | Status: DC

## 2016-05-05 ENCOUNTER — Telehealth: Payer: Self-pay | Admitting: Internal Medicine

## 2016-05-05 NOTE — Telephone Encounter (Signed)
Called patient via [facility] [place], [place] # [id number]. Pt verified name and date of birth. Pt notified of the following per Dr.Langeland:   Extremely iron deficient anemic and prediabetic. RX for iron two times daily and metformin 500 mg two times daily has been prescribed and sent to pharmacy on file. May have some mild abdominal discomfort/diarrhea for the first few days but it should subside soon after. The metformin will have her body process sugars better and lose weight which will overall help get rid of diabetes. Printout of education tips in spanish will be provided to her the next time she comes to the clinic.  DM education tips given: Aim for 30 minutes of exercise most days Rethink what you drink- prefer water Aim for 2-3 carb choices per meal +/- 1 either way Aim for 0-15 carbs per snack if hungry Include protein in moderation with your meals and snacks Consider reading food labels for total carb and fat grams of foods Consider checking blood glucose at alternate times per day Continue taking medication as directed Be mindful about how much sugar you are adding to beverages and other foods If drinking fruit punch, find one with no sugar Measure and decrease portions of carbohydrate foods Make your plate and don't go back for seconds.   Patient voiced understanding.

## 2016-05-05 NOTE — Telephone Encounter (Signed)
-----   Message from Pete Glatterawn T Langeland, MD sent at 05/04/2016  8:38 AM EDT ----- Please call w/ Spanish interpreter.  Pt extremely iron deficient anemic, take iron bid, also PREdiabetes by labs.   i gave her prx for both iron and metformin 500bid.  She may have some mild abd discomfort/diarrhea first few days, but should get better soon after.  The metformin will her her body process sugars better and lose weight, which overall helps get rid of diabetes.  Tell her she can come in clinic to get print out info of dm education/diet in spanish as well to help. thanks  Aim for 30 minutes of exercise most days. Rethink what you drink. Water is great! Aim for 2-3 Carb Choices per meal (30-45 grams) +/- 1 either way.  Aim for 0-15 Carbs per snack if hungry.  Include protein in moderation with your meals and snacks.  Consider reading food labels for Total Carbohydrate and Fat Grams of foods  Consider checking blood glucose (accuchecks/BG) at alternate times per day.  Continue taking medication as directed. Be mindful about how much sugar you are adding to beverages and other foods. Fruit Punch - find one with no sugar  Measure and decrease portions of carbohydrate foods. Make your plate and don't go back for seconds.

## 2016-06-14 ENCOUNTER — Encounter: Payer: Self-pay | Admitting: Obstetrics and Gynecology

## 2016-06-16 ENCOUNTER — Telehealth: Payer: Self-pay | Admitting: General Practice

## 2016-06-16 DIAGNOSIS — N83209 Unspecified ovarian cyst, unspecified side: Secondary | ICD-10-CM

## 2016-06-16 NOTE — Telephone Encounter (Signed)
Called patient with [employer] for interpreter and informed patient of missed appt in our office and need for ultrasound on 8/2 @ 3:45pm. Patient verbalized understanding & states she was not aware of the appt. Told patient we will get it rescheduled. Patient verbalized understanding & had no questions

## 2016-06-22 ENCOUNTER — Ambulatory Visit (HOSPITAL_COMMUNITY): Payer: Self-pay

## 2016-06-23 ENCOUNTER — Ambulatory Visit (HOSPITAL_COMMUNITY)
Admission: RE | Admit: 2016-06-23 | Discharge: 2016-06-23 | Disposition: A | Discharge status: Home / Self Care | Payer: Self-pay | Source: Ambulatory Visit | Attending: Obstetrics and Gynecology | Admitting: Obstetrics and Gynecology

## 2016-06-23 DIAGNOSIS — N83209 Unspecified ovarian cyst, unspecified side: Secondary | ICD-10-CM

## 2016-06-23 DIAGNOSIS — N83201 Unspecified ovarian cyst, right side: Secondary | ICD-10-CM | POA: Insufficient documentation

## 2016-06-23 DIAGNOSIS — R938 Abnormal findings on diagnostic imaging of other specified body structures: Secondary | ICD-10-CM | POA: Insufficient documentation

## 2016-07-14 ENCOUNTER — Encounter: Payer: Self-pay | Admitting: Obstetrics and Gynecology

## 2019-08-24 ENCOUNTER — Encounter: Payer: Self-pay | Admitting: Internal Medicine

## 2019-08-24 ENCOUNTER — Ambulatory Visit: Payer: Self-pay | Attending: Internal Medicine | Admitting: Internal Medicine

## 2019-08-24 ENCOUNTER — Other Ambulatory Visit: Payer: Self-pay

## 2019-08-24 ENCOUNTER — Ambulatory Visit (HOSPITAL_BASED_OUTPATIENT_CLINIC_OR_DEPARTMENT_OTHER): Payer: Self-pay | Admitting: Pharmacist

## 2019-08-24 VITALS — BP 111/72 | HR 92 | Temp 98.4°F | Resp 18 | Ht 58.5 in | Wt 136.0 lb

## 2019-08-24 DIAGNOSIS — Z862 Personal history of diseases of the blood and blood-forming organs and certain disorders involving the immune mechanism: Secondary | ICD-10-CM

## 2019-08-24 DIAGNOSIS — Z0000 Encounter for general adult medical examination without abnormal findings: Secondary | ICD-10-CM

## 2019-08-24 DIAGNOSIS — Z23 Encounter for immunization: Secondary | ICD-10-CM

## 2019-08-24 DIAGNOSIS — Z124 Encounter for screening for malignant neoplasm of cervix: Secondary | ICD-10-CM

## 2019-08-24 DIAGNOSIS — Z114 Encounter for screening for human immunodeficiency virus [HIV]: Secondary | ICD-10-CM

## 2019-08-24 DIAGNOSIS — Z1231 Encounter for screening mammogram for malignant neoplasm of breast: Secondary | ICD-10-CM

## 2019-08-24 DIAGNOSIS — H538 Other visual disturbances: Secondary | ICD-10-CM

## 2019-08-24 DIAGNOSIS — A64 Unspecified sexually transmitted disease: Secondary | ICD-10-CM

## 2019-08-24 DIAGNOSIS — R7303 Prediabetes: Secondary | ICD-10-CM

## 2019-08-24 DIAGNOSIS — Z1211 Encounter for screening for malignant neoplasm of colon: Secondary | ICD-10-CM

## 2019-08-24 DIAGNOSIS — N921 Excessive and frequent menstruation with irregular cycle: Secondary | ICD-10-CM

## 2019-08-24 LAB — POCT GLYCOSYLATED HEMOGLOBIN (HGB A1C): Hemoglobin A1C: 5.6 % (ref 4.0–5.6)

## 2019-08-24 NOTE — Progress Notes (Signed)
Patient presents for vaccination against influenza per orders of Dr. Johnson. Consent given. Counseling provided. No contraindications exists. Vaccine administered without incident.   
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
## 2019-08-25 LAB — CBC
Hematocrit: 40 % (ref 34.0–46.6)
Hemoglobin: 13.8 g/dL (ref 11.1–15.9)
MCH: 30 pg (ref 26.6–33.0)
MCHC: 34.5 g/dL (ref 31.5–35.7)
MCV: 87 fL (ref 79–97)
Platelets: 261 10*3/uL (ref 150–450)
RBC: 4.6 x10E6/uL (ref 3.77–5.28)
RDW: 16 % — ABNORMAL HIGH (ref 11.7–15.4)
WBC: 5.9 10*3/uL (ref 3.4–10.8)

## 2019-08-25 LAB — LUTEINIZING HORMONE: LH: 27.1 m[IU]/mL

## 2019-08-25 LAB — COMPREHENSIVE METABOLIC PANEL
ALT: 31 IU/L (ref 0–32)
AST: 30 IU/L (ref 0–40)
Albumin/Globulin Ratio: 1.3 (ref 1.2–2.2)
Albumin: 4.1 g/dL (ref 3.8–4.8)
Alkaline Phosphatase: 100 IU/L (ref 39–117)
BUN/Creatinine Ratio: 12 (ref 9–23)
BUN: 8 mg/dL (ref 6–24)
Bilirubin Total: 0.2 mg/dL (ref 0.0–1.2)
CO2: 26 mmol/L (ref 20–29)
Calcium: 9.4 mg/dL (ref 8.7–10.2)
Chloride: 104 mmol/L (ref 96–106)
Creatinine, Ser: 0.68 mg/dL (ref 0.57–1.00)
GFR calc Af Amer: 118 mL/min/{1.73_m2} (ref >59)
GFR calc non Af Amer: 102 mL/min/{1.73_m2} (ref >59)
Globulin, Total: 3.2 g/dL (ref 1.5–4.5)
Glucose: 91 mg/dL (ref 65–99)
Potassium: 4 mmol/L (ref 3.5–5.2)
Sodium: 143 mmol/L (ref 134–144)
Total Protein: 7.3 g/dL (ref 6.0–8.5)

## 2019-08-25 LAB — LIPID PANEL
Chol/HDL Ratio: 7.8 ratio — ABNORMAL HIGH (ref 0.0–4.4)
Cholesterol, Total: 257 mg/dL — ABNORMAL HIGH (ref 100–199)
HDL: 33 mg/dL — ABNORMAL LOW (ref >39)
Triglycerides: 848 mg/dL (ref 0–149)

## 2019-08-25 LAB — FOLLICLE STIMULATING HORMONE: FSH: 43.6 m[IU]/mL

## 2019-08-25 LAB — HIV ANTIBODY (ROUTINE TESTING W REFLEX): HIV Screen 4th Generation wRfx: NONREACTIVE

## 2019-08-26 ENCOUNTER — Other Ambulatory Visit: Payer: Self-pay | Admitting: Internal Medicine

## 2019-08-26 DIAGNOSIS — E781 Pure hyperglyceridemia: Secondary | ICD-10-CM

## 2019-08-26 DIAGNOSIS — N921 Excessive and frequent menstruation with irregular cycle: Secondary | ICD-10-CM

## 2019-08-26 MED ORDER — GEMFIBROZIL 600 MG PO TABS: 600.0000 mg | ORAL_TABLET | Freq: Two times a day (BID) | ORAL | 4 refills | Status: DC

## 2019-08-27 ENCOUNTER — Ambulatory Visit: Payer: Self-pay | Admitting: Pharmacist

## 2019-08-27 MED FILL — GEMFIBROZIL 600 MG TAB: 600 | 30 days supply | Qty: 60 | Fill #0

## 2019-08-27 NOTE — Addendum Note (Signed)
Addended bySteffanie Dunn on: 08/27/2019 12:15 PM   Modules accepted: Orders

## 2019-08-27 NOTE — Addendum Note (Signed)
Addended by: Steffanie Dunn on: 08/27/2019 12:12 PM   Modules accepted: Orders

## 2019-08-29 LAB — CERVICOVAGINAL ANCILLARY ONLY
Bacterial Vaginitis (gardnerella): POSITIVE — AB
Candida Glabrata: NEGATIVE
Candida Vaginitis: NEGATIVE
Chlamydia: NEGATIVE
Neisseria Gonorrhea: NEGATIVE
Trichomonas: NEGATIVE

## 2019-08-30 ENCOUNTER — Other Ambulatory Visit: Payer: Self-pay | Admitting: Internal Medicine

## 2019-08-30 MED ORDER — METRONIDAZOLE 500 MG PO TABS: 500.0000 mg | ORAL_TABLET | Freq: Two times a day (BID) | ORAL | 0 refills | Status: DC

## 2019-08-30 MED FILL — metroNIDAZOLE 500 MG TABS: 500 | 7 days supply | Qty: 14 | Fill #0

## 2019-09-03 ENCOUNTER — Telehealth: Payer: Self-pay | Admitting: *Deleted

## 2019-09-03 NOTE — Telephone Encounter (Signed)
Medical Assistant used [employer] to contact patient.  Interpreter Name: Jon Billings Interpreter #: [phone] Patient is aware of not being anemic and LFT and kidney being normal. Patient is aware of TG being elevated and needing to implement healthy eating habits and exercising 3-4 times a week.  Patient is aware of gemfibrizol being sent to the pharmacy. Patient is aware of [place] being scheduled to address heavy periods but hormone level suggesting perimenopausal. Patient is also aware of BV being present and needing to take Flagyl for treatment.

## 2019-09-03 NOTE — Telephone Encounter (Signed)
-----   Message from Ladell Pier, MD sent at 08/30/2019  7:56 AM EDT ----- Let patient know that she tested positive for bacterial vaginosis.  This is not a sexually transmitted infection.  We will treat with an antibiotic called Flagyl.  Prescription sent to our pharmacy.

## 2019-09-04 LAB — CYTOLOGY - PAP
Diagnosis: REACTIVE
HPV 16: POSITIVE — AB
HPV 18 / 45: NEGATIVE
High risk HPV: POSITIVE — AB

## 2019-09-05 ENCOUNTER — Other Ambulatory Visit: Payer: Self-pay | Admitting: Internal Medicine

## 2019-09-05 DIAGNOSIS — R87810 Cervical high risk human papillomavirus (HPV) DNA test positive: Secondary | ICD-10-CM

## 2019-09-05 DIAGNOSIS — N879 Dysplasia of cervix uteri, unspecified: Secondary | ICD-10-CM | POA: Insufficient documentation

## 2019-09-12 ENCOUNTER — Telehealth: Payer: Self-pay

## 2019-09-12 NOTE — Telephone Encounter (Signed)
[employer] Mickel Baas  Id# [id number]  contacted pt to go over pap results pt didn't answer and was unable to lvm due to vm not being set up

## 2019-11-27 ENCOUNTER — Ambulatory Visit: Payer: Self-pay | Admitting: Internal Medicine

## 2019-12-13 ENCOUNTER — Ambulatory Visit: Payer: Self-pay | Attending: Internal Medicine | Admitting: Internal Medicine

## 2019-12-13 ENCOUNTER — Other Ambulatory Visit: Payer: Self-pay

## 2019-12-13 DIAGNOSIS — E781 Pure hyperglyceridemia: Secondary | ICD-10-CM

## 2019-12-13 DIAGNOSIS — R87810 Cervical high risk human papillomavirus (HPV) DNA test positive: Secondary | ICD-10-CM

## 2019-12-13 MED ORDER — GEMFIBROZIL 600 MG PO TABS: 600.0000 mg | ORAL_TABLET | Freq: Two times a day (BID) | ORAL | 4 refills | Status: AC

## 2019-12-13 NOTE — Progress Notes (Signed)
Virtual Visit via Telephone Note Due to current restrictions/limitations of in-office visits due to the COVID-19 pandemic, this scheduled clinical appointment was converted to a telehealth visit  I connected with Jenna Wolfe on 12/13/19 at 5:36 p.m by telephone and verified that I am speaking with the correct person using two identifiers. I am in my office.  The patient is at home.  Only the patient, myself and jamie from [employer] (([phone]) participated in this encounter.  I discussed the limitations, risks, security and privacy concerns of performing an evaluation and management service by telephone and the availability of in person appointments. I also discussed with the patient that there may be a patient responsible charge related to this service. The patient expressed understanding and agreed to proceed.   History of Present Illness: History of ovarian cyst, iron deficiency anemia, HyperTG prediabetes, cleft palate and lip. Last seen 10.2020   TG 848 on blood test done 08/2019.  I recommended dietary change and starting a Gemfibrozil.  Pt not sure if she got the med.  Will pick up tomorrow  Anemia:  No longer anemic based on last blood test. Also no longer preDM based on A1C.  Walks daily for exercise  PAP smear showed no abn cells but was positive for HPV 16.  Referred to GYN.  Pt uninsured so she has to go through [employer].  Pt states she was called and given a phone # to call [facility] but no one answers on the # given.    Observations/Objective: Results for orders placed or performed in visit on 08/24/19  CBC  Result Value Ref Range   WBC 5.9 3.4 - 10.8 x10E3/uL   RBC 4.60 3.77 - 5.28 x10E6/uL   Hemoglobin 13.8 11.1 - 15.9 g/dL   Hematocrit 81.0 17.5 - 46.6 %   MCV 87 79 - 97 fL   MCH 30.0 26.6 - 33.0 pg   MCHC 34.5 31.5 - 35.7 g/dL   RDW 10.2 (H) 58.5 - 27.7 %   Platelets 261 150 - 450 x10E3/uL  Comprehensive metabolic panel  Result Value Ref Range    Glucose 91 65 - 99 mg/dL   BUN 8 6 - 24 mg/dL   Creatinine, Ser 8.24 0.57 - 1.00 mg/dL   GFR calc non Af Amer 102 >59 mL/min/1.73   GFR calc Af Amer 118 >59 mL/min/1.73   BUN/Creatinine Ratio 12 9 - 23   Sodium 143 134 - 144 mmol/L   Potassium 4.0 3.5 - 5.2 mmol/L   Chloride 104 96 - 106 mmol/L   CO2 26 20 - 29 mmol/L   Calcium 9.4 8.7 - 10.2 mg/dL   Total Protein 7.3 6.0 - 8.5 g/dL   Albumin 4.1 3.8 - 4.8 g/dL   Globulin, Total 3.2 1.5 - 4.5 g/dL   Albumin/Globulin Ratio 1.3 1.2 - 2.2   Bilirubin Total 0.2 0.0 - 1.2 mg/dL   Alkaline Phosphatase 100 39 - 117 IU/L   AST 30 0 - 40 IU/L   ALT 31 0 - 32 IU/L  Lipid panel  Result Value Ref Range   Cholesterol, Total 257 (H) 100 - 199 mg/dL   Triglycerides 235 (HH) 0 - 149 mg/dL   HDL 33 (L) >36 mg/dL   VLDL Cholesterol Cal Comment (A) 5 - 40 mg/dL   LDL Chol Calc (NIH) Comment (A) 0 - 99 mg/dL   Chol/HDL Ratio 7.8 (H) 0.0 - 4.4 ratio  Follicle stimulating hormone  Result Value Ref Range   [facility]  43.6 mIU/mL  Luteinizing hormone  Result Value Ref Range   LH 27.1 mIU/mL  HIV Antibody (routine testing w rflx)  Result Value Ref Range   HIV Screen 4th Generation wRfx Non Reactive Non Reactive  HgB A1c  Result Value Ref Range   Hemoglobin A1C 5.6 4.0 - 5.6 %   HbA1c POC (<> result, manual entry)     HbA1c, POC (prediabetic range)     HbA1c, POC (controlled diabetic range)    Cytology - PAP  Result Value Ref Range   High risk HPV Positive (A)    HPV 16 Positive (A)    HPV 18 / 45 Negative    Adequacy      Satisfactory for evaluation; transformation zone component PRESENT.   Diagnosis - Benign reactive/reparative changes   Cervicovaginal ancillary only  Result Value Ref Range   Neisseria Gonorrhea Negative    Chlamydia Negative    Trichomonas Negative    Bacterial Vaginitis (gardnerella) Positive (A)    Candida Vaginitis Negative    Candida Glabrata Negative    Adequacy Molecular only    Diagnosis Molecular only (A)       Assessment and Plan: 1. Hypertriglyceridemia - gemfibrozil (LOPID) 600 MG tablet; Take 1 tablet (600 mg total) by mouth 2 (two) times daily before a meal.  Dispense: 60 tablet; Refill: 4  2. Cervical high risk human papillomavirus (HPV) DNA test positive Message sent to referral coordinator today to  try get appt for her with [facility] or tell her how to apply for [facility] discount   Follow Up Instructions: 4 mth   I discussed the assessment and treatment plan with the patient. The patient was provided an opportunity to ask questions and all were answered. The patient agreed with the plan and demonstrated an understanding of the instructions.   The patient was advised to call back or seek an in-person evaluation if the symptoms worsen or if the condition fails to improve as anticipated.  I provided 11 minutes of non-face-to-face time during this encounter.   Karle Plumber, MD

## 2019-12-16 ENCOUNTER — Telehealth: Payer: Self-pay | Admitting: Internal Medicine

## 2019-12-16 NOTE — Telephone Encounter (Signed)
-----   Message from Dionne Bucy sent at 12/14/2019  4:07 PM EST ----- I Michel Harrow [place]  [phone]  [facility] She told me to send her an in basket with the patient info and she will call her with an interpreter and I contacted patient and let her know that someone will be calling her with an interpreter .  ----- Message ----- From: Marcine Matar, MD Sent: 12/13/2019  10:56 PM EST To: Dionne Bucy  This pt referred to GYN in the fall.  Does not have [facility] discount.  I see in the referral it said she has to go through [facility] since no insurance.  Pt states she was called by someone and given a phone # to call.  However, no one picks up on the # given.  She has high risk HPV on pap which puts her at risk for cervical cancer.  It is important that she gets in with gyn.  Could you facilitate appt with [facility] or tell her how to apply for [facility] discount so she can get in with GYN?  Thanks.

## 2019-12-16 NOTE — Telephone Encounter (Signed)
Information sent as requested.

## 2019-12-19 ENCOUNTER — Other Ambulatory Visit (HOSPITAL_COMMUNITY): Payer: Self-pay

## 2019-12-19 ENCOUNTER — Telehealth: Payer: Self-pay | Admitting: Internal Medicine

## 2019-12-19 DIAGNOSIS — Z1231 Encounter for screening mammogram for malignant neoplasm of breast: Secondary | ICD-10-CM

## 2019-12-19 DIAGNOSIS — R87810 Cervical high risk human papillomavirus (HPV) DNA test positive: Secondary | ICD-10-CM

## 2019-12-19 NOTE — Telephone Encounter (Signed)
-----   Message from Dionne Bucy sent at 12/17/2019  3:56 PM EST ----- Regarding: GYN REFERRAL Good Afternoon  Dr Alvis Lemmings   I sent your info to them  and their respond  was   You will need to refer her to the [facility]. Based on that result [facility] would not cover the follow-up.  Thanks,  Wynona Canes  Can you please placed a new referral for [facility] Thank you   She has an appointment for [facility] on  01-29-2020 @ 8am   ----- Message ----- From: Marcine Matar, MD Sent: 12/16/2019  11:07 AM EST To: Dionne Bucy  Pt had pap done 08/24/2019. PAP was  negative for abnormal cell but positive for HPV 16.  This results is in the EMR. ----- Message ----- From: Dionne Bucy Sent: 12/14/2019   4:07 PM EST To: Marcine Matar, MD  I Michel Harrow [place]  [phone]  [facility] She told me to send her an in basket with the patient info and she will call her with an interpreter and I contacted patient and let her know that someone will be calling her with an interpreter .  ----- Message ----- From: Marcine Matar, MD Sent: 12/13/2019  10:56 PM EST To: Dionne Bucy  This pt referred to GYN in the fall.  Does not have [facility] discount.  I see in the referral it said she has to go through [facility] since no insurance.  Pt states she was called by someone and given a phone # to call.  However, no one picks up on the # given.  She has high risk HPV on pap which puts her at risk for cervical cancer.  It is important that she gets in with gyn.  Could you facilitate appt with [facility] or tell her how to apply for [facility] discount so she can get in with GYN?  Thanks.

## 2019-12-20 ENCOUNTER — Telehealth: Payer: Self-pay | Admitting: Internal Medicine

## 2019-12-20 NOTE — Telephone Encounter (Signed)
-----   Message from Dionne Bucy sent at 12/19/2019  3:59 PM EST ----- Regarding: RE: GYN REFERRAL Patient schedule with [facility]  02-06-2020 @ 1:55pm  with Dr  Valmeyer Bing   ----- Message ----- From: Marcine Matar, MD Sent: 12/19/2019   1:05 PM EST To: Dionne Bucy Subject: RE: GYN REFERRAL                               Done ----- Message ----- From: Dionne Bucy Sent: 12/17/2019   3:56 PM EST To: Marcine Matar, MD Subject: GYN REFERRAL                                   Good Afternoon  Dr Alvis Lemmings   I sent your info to them  and their respond  was   You will need to refer her to the [facility]. Based on that result [facility] would not cover the follow-up.  Thanks,  Wynona Canes  Can you please placed a new referral for [facility] clinic Thank you   She has an appointment for [facility] on  01-29-2020 @ 8am   ----- Message ----- From: Marcine Matar, MD Sent: 12/16/2019  11:07 AM EST To: Dionne Bucy  Pt had pap done 08/24/2019. PAP was  negative for abnormal cell but positive for HPV 16.  This results is in the EMR. ----- Message ----- From: Dionne Bucy Sent: 12/14/2019   4:07 PM EST To: Marcine Matar, MD  I Michel [place]  [phone]  [facility] She told me to send her an in basket with the patient info and she will call her with an interpreter and I contacted patient and let her know that someone will be calling her with an interpreter .  ----- Message ----- From: Marcine Matar, MD Sent: 12/13/2019  10:56 PM EST To: Dionne Bucy  This pt referred to GYN in the fall.  Does not have [facility] discount.  I see in the referral it said she has to go through [facility] since no insurance.  Pt states she was called by someone and given a phone # to call.  However, no one picks up on the # given.  She has high risk HPV on pap which puts her at risk for cervical cancer.  It is important that she gets in with gyn.  Could you facilitate appt with [facility] or tell  her how to apply for [facility] discount so she can get in with GYN?  Thanks.

## 2020-01-29 ENCOUNTER — Encounter (INDEPENDENT_AMBULATORY_CARE_PROVIDER_SITE_OTHER): Payer: Self-pay

## 2020-01-29 ENCOUNTER — Other Ambulatory Visit: Payer: Self-pay

## 2020-01-29 ENCOUNTER — Ambulatory Visit: Payer: Self-pay | Admitting: Women's Health

## 2020-01-29 ENCOUNTER — Ambulatory Visit
Admission: RE | Admit: 2020-01-29 | Discharge: 2020-01-29 | Disposition: A | Discharge status: Home / Self Care | Payer: No Typology Code available for payment source | Source: Ambulatory Visit | Attending: Obstetrics and Gynecology | Admitting: Obstetrics and Gynecology

## 2020-01-29 ENCOUNTER — Encounter: Payer: Self-pay | Admitting: Women's Health

## 2020-01-29 VITALS — BP 110/68 | Temp 97.8°F | Wt 131.0 lb

## 2020-01-29 DIAGNOSIS — Z1231 Encounter for screening mammogram for malignant neoplasm of breast: Secondary | ICD-10-CM

## 2020-01-29 DIAGNOSIS — Z1239 Encounter for other screening for malignant neoplasm of breast: Secondary | ICD-10-CM

## 2020-01-29 NOTE — Patient Instructions (Signed)
Autoexamen de mamas Breast Self-Awareness Autoexaminarse las mamas significa familiarizarse con el aspecto y la sensacin de las mamas al tacto. Incluye revisarse las mamas habitualmente e informarle al mdico acerca de cualquier cambio. Es importante autoexaminarse las mamas. En ocasiones, los cambios pueden no ser perjudiciales (son benignos), pero a veces un cambio en las mamas puede ser un signo de un problema mdico grave. Es importante aprender a realizar este procedimiento de modo correcto para que pueda detectar los problemas de manera temprana, cuando es ms probable que el tratamiento resulte exitoso. Todas las mujeres deben autoexaminarse las mamas, incluso aquellas que se sometieron a implantes mamarios. Lo que necesita:  Un espejo.  Una habitacin bien iluminada. Cmo realizar el autoexamen de mamas Un autoexamen de mamas es una forma de aprender qu es normal para sus mamas y si sufren modificaciones. Para hacer un autoexamen de las mamas: Busque cambios  1. Qutese toda la ropa por encima de la cintura. 2. Prese frente a un espejo en una habitacin con buena iluminacin. 3. Apoye las manos en las caderas. 4. Empuje con fuerza hacia abajo con las manos. 5. Compare las mamas en el espejo. Busque diferencias entre ellas (asimetra), por ejemplo: ? Diferencias en la forma. ? Diferencias en el tamao. ? Pliegues, depresiones y ndulos en una mama, y no en la otra. 6. Observe cada mama para buscar cambios en la piel, por ejemplo: ? Enrojecimiento. ? Zonas escamosas. 7. Observe si hay cambios en los pezones, por ejemplo: ? Secrecin. ? Sangrado. ? Hoyuelos. ? Enrojecimiento. ? Un cambio en la posicin. Palpe si hay cambios Plpese las mamas con cuidado para detectar ndulos y cambios. Lo mejor es hacerlo mientras est acostada boca arriba en el piso y nuevamente mientras est sentada o de pie en la ducha o la baera con agua jabonosa en la piel. Plpese cada mama de la  siguiente forma: 1. Coloque el brazo del lado de la mama que se examina por arriba de la cabeza. 2. Plpese la mama con la otra mano. 3. Comience en la zona del pezn y haga crculos superpuestos de de pulgada (2cm). Para hacerlo, use las yemas de los tres dedos del medio. Ejerza una presin suave, luego mediana y luego firme. La presin suave le permitir palpar el tejido ms cercano a la piel. La presin mediana le permitir palpar el tejido que est un poco ms profundo. La presin firme le permitir palpar el tejido ms cercano a las costillas. 4. Continuar superponiendo crculos y vaya hacia abajo, hasta sentir las costillas, por debajo del pecho. 5. Desplcese a una distancia del ancho de un dedo hacia el centro del cuerpo. Siga con los crculos superpuestos de de pulgada (2cm) para palpar la mama, mientras asciende lentamente hacia la clavcula. 6. Contine con el examen hacia arriba y hacia abajo con las tres presiones, hasta llegar a la axila.  Anote sus hallazgos Anotar lo que encuentra puede ayudarla a recordar qu debe consultar con el mdico. Escriba los siguientes datos:  Qu es normal para cada mama.  Cualquier cambio que encuentre en cada mama, por ejemplo: ? La clase de cambios que encuentra. ? Dolor o sensibilidad. ? Si hay bultos, su tamao y ubicacin.  En qu momento se encuentra del ciclo menstrual, si usted todava est menstruando. Recomendaciones y consejos generales  Examnese las mamas todos los meses.  Si est amamantando, el mejor momento para examinarse las mamas es despus de amamantar o de usar un sacaleches.  Si menstra,   el mejor momento para examinarse las mamas es 5 a 7das despus del perodo menstrual. Durante el perodo menstrual, las mamas en general tienen ms bultos, y tal vez sea ms difcil percibir los cambios.  Con el tiempo y la prctica, se familiarizar con las variaciones de las mamas y se sentir ms cmoda con el  examen. Comunquese con un mdico si:  Observa un cambio en la forma o el tamao de las mamas o los pezones.  Observa un cambio en la piel de las mamas o los pezones, como la piel enrojecida o escamosa.  Tiene una secrecin anormal proveniente de los pezones.  Encuentra un ndulo o una zona engrosada que no tena antes.  Tiene dolor en las mamas.  Tiene alguna inquietud relacionada con la salud de la mama. Resumen  El autoexamen de mamas incluye buscar cambios fsicos en las mamas, y tambin palpar para detectar cualquier cambio en las mamas.  El autoexamen de mamas debe hacerse frente a un espejo en una habitacin bien iluminada.  Debe examinarse las mamas todos los meses. Si menstra, el mejor momento para examinarse las mamas es de 5 a 7das despus del perodo menstrual.  Informe al mdico si nota cambios en las mamas, como cambios en el tamao, cambios en la piel, dolor o sensibilidad, o un lquido inusual que sale de los pezones. Esta informacin no tiene como fin reemplazar el consejo del mdico. Asegrese de hacerle al mdico cualquier pregunta que tenga. Document Revised: 08/08/2018 Document Reviewed: 08/08/2018 Elsevier Patient Education  2020 Elsevier Inc.  

## 2020-01-29 NOTE — Progress Notes (Signed)
Ms. Jenna Wolfe is a 49 y.o. female who presents to [facility] clinic today with complaint of discomfort when lowering right arm intermittently x 1 year, rates pain as 2/10.    Pap Smear: Pap not smear completed today. Last Pap smear was 08/24/2019 at PCP clinic and was abnormal - NILM/hrHPV+/HPV16+. Per patient has no history of an abnormal Pap smear. Last Pap smear result is available in Epic.   Physical exam: Breasts Breasts symmetrical. No skin abnormalities bilateral breasts. No nipple retraction bilateral breasts. No nipple discharge bilateral breasts. No lymphadenopathy. No lumps palpated bilateral breasts.       Pelvic/Bimanual Pap is not indicated today    Smoking History: Patient has never smoked not referred to quit line.    Patient Navigation: Patient education provided. Access to services provided for patient through [facility] program. Spanish interpreter provided. No transportation provided   Colorectal Cancer Screening: Per patient has never had colonoscopy completed No complaints today.    Breast and Cervical Cancer Risk Assessment: Patient has family history of breast cancer (cousin's daughter), no known genetic mutations, or radiation treatment to the chest before age 46. Patient does not have history of cervical dysplasia, immunocompromised, or DES exposure in-utero.  Risk Assessment    Risk Scores      01/29/2020   Last edited by: Narda Rutherford, LPN   5-year risk: 0.9 %   Lifetime risk: 7.9 %          A: [facility] exam without pap smear Complaint of discomfort when lowering right arm intermittently x 1 year, rates pain as 2/10.  P: Referred [facility] to [facility] for a screening mammogram. Appointment scheduled 01/29/2020. Message sent to [facility] clinic to schedule for colposcopy. Patient also given clinic number to call. Patient given application to apply for [facility] and referred to [facility] for primary care concerns.  [name], NP 01/29/2020 8:44 AM

## 2020-01-30 ENCOUNTER — Encounter: Payer: Self-pay | Admitting: Obstetrics and Gynecology

## 2020-01-31 ENCOUNTER — Encounter (HOSPITAL_COMMUNITY): Payer: Self-pay | Admitting: Emergency Medicine

## 2020-01-31 ENCOUNTER — Emergency Department (HOSPITAL_COMMUNITY): Admission: EM | Admit: 2020-01-31 | Payer: No Typology Code available for payment source | Source: Home / Self Care

## 2020-01-31 ENCOUNTER — Emergency Department (HOSPITAL_COMMUNITY)
Admission: EM | Admit: 2020-01-31 | Discharge: 2020-01-31 | Disposition: A | Discharge status: Home / Self Care | Payer: No Typology Code available for payment source | Attending: Emergency Medicine | Admitting: Emergency Medicine

## 2020-01-31 ENCOUNTER — Other Ambulatory Visit: Payer: Self-pay

## 2020-01-31 ENCOUNTER — Ambulatory Visit: Payer: Self-pay | Attending: Internal Medicine | Admitting: Physician Assistant

## 2020-01-31 VITALS — BP 113/67 | HR 96 | Temp 97.6°F | Resp 16 | Ht 59.0 in | Wt 135.0 lb

## 2020-01-31 DIAGNOSIS — H5712 Ocular pain, left eye: Secondary | ICD-10-CM

## 2020-01-31 DIAGNOSIS — R0989 Other specified symptoms and signs involving the circulatory and respiratory systems: Secondary | ICD-10-CM

## 2020-01-31 DIAGNOSIS — M25511 Pain in right shoulder: Secondary | ICD-10-CM

## 2020-01-31 DIAGNOSIS — R09A2 Foreign body sensation, throat: Secondary | ICD-10-CM

## 2020-01-31 DIAGNOSIS — G8929 Other chronic pain: Secondary | ICD-10-CM

## 2020-01-31 DIAGNOSIS — Z79899 Other long term (current) drug therapy: Secondary | ICD-10-CM | POA: Insufficient documentation

## 2020-01-31 DIAGNOSIS — Z603 Acculturation difficulty: Secondary | ICD-10-CM

## 2020-01-31 DIAGNOSIS — Z789 Other specified health status: Secondary | ICD-10-CM

## 2020-01-31 MED ORDER — CYCLOBENZAPRINE HCL 10 MG PO TABS: 10.0000 mg | ORAL_TABLET | Freq: Two times a day (BID) | ORAL | 0 refills | Status: AC | PRN

## 2020-01-31 MED ORDER — NAPROXEN 500 MG PO TABS: 500.0000 mg | ORAL_TABLET | Freq: Two times a day (BID) | ORAL | 0 refills | Status: AC

## 2020-01-31 MED FILL — NAPROXEN 500 MG TABLET: 500 | 30 days supply | Qty: 60 | Fill #0

## 2020-01-31 NOTE — ED Triage Notes (Signed)
Translator / pt stated, I was sent here cause I have rt. Shoulder pain and elbow pain for 6-8 months.
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
## 2020-01-31 NOTE — ED Provider Notes (Signed)
[facility] EMERGENCY DEPARTMENT Provider Note   CSN: [id number] Arrival date & time: 01/31/20  1730     History Chief Complaint  Patient presents with  . Shoulder Pain    Jenna Wolfe is a 49 y.o. female.  The history is provided by the patient and medical records. The history is limited by a language barrier. A language interpreter was used.     50 year old Hispanic speaking female presenting with multiple complaints.  History obtained using [employer] interpreter.  Patient report having pain to her right shoulder elbow for the past 8 months without any known injury.  She is right-hand dominant.  She denies any specific treatment for that.  She also complains of left pain on a daily basis for nearly 2 years described as a pressure and burning sensation with occasional blurry vision but no vision loss.  Furthermore, she also occasionally has trouble with swallowing for the past 9 years.  She was initially seen in the office at her PCP office today for her complaint.  Patient was evaluated for her symptoms and was provided appropriate medication as treatment with recommendation to go to the ER if symptoms still persist despite her treatment.  However it appears that patient was misunderstood and went to the ER after her PCP office visit today.  Her primary complaint is her right shoulder and elbow pain.  Pain is achy throbbing waxing and waning sometimes improves when she moves her shoulder and her elbow to alleviate his symptoms.  She is right-handed and does perform somewhat of a repetitive movement with her arm.  She denies any recent injury.  No complaints of neck pain.  No fever or chills.  History reviewed. No pertinent past medical history.  Patient Active Problem List   Diagnosis Date Noted  . Pap smear of cervix shows high risk HPV present 09/05/2019  . Hypertriglyceridemia 08/26/2019    Past Surgical History:  Procedure Laterality Date  . CESAREAN  SECTION     3  . PALATE SURGERY       OB History   No obstetric history on file.     Family History  Problem Relation Age of Onset  . Kidney disease Father     Social History   Tobacco Use  . Smoking status: Never Smoker  . Smokeless tobacco: Never Used  Substance Use Topics  . Alcohol use: No  . Drug use: No    Home Medications Prior to Admission medications   Medication Sig Start Date End Date Taking? Authorizing Provider  gemfibrozil (LOPID) 600 MG tablet Take 1 tablet (600 mg total) by mouth 2 (two) times daily before a meal. 12/13/19   Marcine Matar, MD  naproxen (NAPROSYN) 500 MG tablet Take 1 tablet (500 mg total) by mouth 2 (two) times daily with a meal. Prn pain 01/31/20   Anders Simmonds, PA-C    Allergies    Patient has no known allergies.  Review of Systems   Review of Systems  All other systems reviewed and are negative.   Physical Exam Updated Vital Signs BP 110/77 (BP Location: Left Arm)   Pulse 80   Temp 98 F (36.7 C) (Oral)   Resp 17   Ht 4\' 11"  (1.499 m)   Wt 61.2 kg   LMP 01/24/2020 (Exact Date)   SpO2 99%   BMI 27.27 kg/m   Physical Exam Vitals and nursing note reviewed.  Constitutional:      General: She is not in  acute distress.    Appearance: She is well-developed.  HENT:     Head: Atraumatic.  Eyes:     Conjunctiva/sclera: Conjunctivae normal.  Musculoskeletal:        General: Tenderness (Mild tenderness to palpation of right shoulder and right elbow on exam however full range of motion without any obvious deformity noted.  Radial pulse 2+, normal grip strength.) present.     Cervical back: Neck supple.  Skin:    Findings: No rash.  Neurological:     Mental Status: She is alert.     ED Results / Procedures / Treatments   Labs (all labs ordered are listed, but only abnormal results are displayed) Labs Reviewed - No data to display  EKG None  Radiology No results found.  Procedures Procedures (including  critical care time)  Medications Ordered in ED Medications - No data to display  ED Course  I have reviewed the triage vital signs and the nursing notes.  Pertinent labs & imaging results that were available during my care of the patient were reviewed by me and considered in my medical decision making (see chart for details).    MDM Rules/Calculators/A&P                       BP 110/77 (BP Location: Left Arm)   Pulse 80   Temp 98 F (36.7 C) (Oral)   Resp 17   Ht 4\' 11"  (1.499 m)   Wt 61.2 kg   LMP 01/24/2020 (Exact Date)   SpO2 99%   BMI 27.27 kg/m   Final Clinical Impression(s) / ED Diagnoses Final diagnoses:  Chronic right shoulder pain    Rx / DC Orders ED Discharge Orders    None     7:03 PM Patient with multiple complaints however her primary complaint is recurrent pain to her right shoulder and elbow for the past 6 to 8 months without any specific injury.  She is right-hand dominant and does do repetitive motion.  I suspect her symptom is likely arthralgia and less likely to be any acute emergent medical condition.  She was seen and evaluated by her PCP today for same and was provide symptomatic treatment with recommended follow-up however she misunderstood and came to the ER for additional exam.  I do not think she would benefit from imaging at this time as my suspicion for fracture or dislocation is very low given her recurrent symptoms for the past several months.  Encourage patient to continue taking the medication previously prescribed and I will give referral to orthopedist for outpatient follow-up as needed.  History was obtained using language interpreter.   Domenic Moras, PA-C 01/31/20 1952    Lucrezia Starch, MD 02/05/20 [phone]
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
## 2020-01-31 NOTE — Patient Instructions (Signed)
Dolor en el hombro Shoulder Pain Muchas cosas pueden provocar dolor en el hombro, por ejemplo:  Una lesin.  Un movimiento del hombro que se repite una y otra vez de la misma manera (uso excesivo).  Dolor en las articulaciones (artritis). El dolor puede deberse a lo siguiente:  Hinchazn e irritacin (inflamacin) de alguna parte del hombro.  Una lesin en la articulacin del hombro.  Una lesin en: ? Los tejidos que conectan el msculo al hueso (tendones). ? Los tejidos que conectan los huesos entre s (ligamentos). ? Los huesos. Siga estas indicaciones en su casa: Controle los cambios en sus sntomas. Informe a su mdico acerca de los cambios. Estas indicaciones pueden ayudarlo con el dolor. Si tiene un cabestrillo:  Use el cabestrillo como se lo haya indicado el mdico. Quteselo solamente como se lo haya indicado el mdico.  Afloje el cabestrillo si los dedos: ? Hormiguean. ? Se adormecen. ? Se tornan fros y de color azul.  Mantenga el cabestrillo limpio.  Si el cabestrillo no es impermeable: ? No deje que se moje. ? Qutese el cabestrillo para ducharse o baarse. Control del dolor, la rigidez y la hinchazn   Si se lo indican, aplique hielo sobre la zona dolorida: ? Ponga el hielo en una bolsa plstica. ? Coloque una toalla entre la piel y la bolsa. ? Coloque el hielo durante 20minutos, 2 a 3veces por da. Deje de aplicarse hielo si no ayuda a aliviar el dolor.  Apriete una pelota blanda o una almohadilla de goma tanto como sea posible. Esto impide que el hombro se hinche. Tambin ayuda a fortalecer el brazo. Indicaciones generales  Tome los medicamentos de venta libre y los recetados solamente como se lo haya indicado el mdico.  Concurra a todas las visitas de seguimiento como se lo haya indicado el mdico. Esto es importante. Comunquese con un mdico si:  El dolor empeora.  Los medicamentos no le alivian el dolor.  Siente un dolor nuevo en el brazo,  la mano o los dedos. Solicite ayuda inmediatamente si:  El brazo, la mano o los dedos: ? Hormiguean. ? Estn adormecidos. ? Estn hinchados. ? Estn doloridos. ? Se tornan de color blanco o azul. Resumen  Varias pueden ser las causas del dolor en el hombro. Estas incluyen lesiones, mover el hombro en el mismo sentido una y otra vez, y dolor en las articulaciones.  Controle los cambios en sus sntomas. Informe a su mdico acerca de los cambios.  Esta afeccin se puede tratar con un cabestrillo, hielo y un medicamento para el dolor.  Comunquese con su mdico si el dolor empeora o tiene un dolor nuevo. Solicite ayuda de inmediato si el brazo, la mano o los dedos se le adormecen o si siente hormigueo, se le hinchan o le duelen.  Concurra a todas las visitas de seguimiento como se lo haya indicado el mdico. Esto es importante. Esta informacin no tiene como fin reemplazar el consejo del mdico. Asegrese de hacerle al mdico cualquier pregunta que tenga. Document Revised: 07/12/2018 Document Reviewed: 07/12/2018 Elsevier Patient Education  2020 Elsevier Inc.   

## 2020-01-31 NOTE — Discharge Instructions (Signed)
Your pain is likely due to arthritis.  Please take medication previously prescribed by your doctor.  Follow-up with orthopedist doctor in 2 weeks if you notice no improvement.

## 2020-02-01 MED FILL — CYCLOBENZAPRINE HCL 10 MG T: 10 | 10 days supply | Qty: 20 | Fill #0

## 2020-02-06 ENCOUNTER — Encounter: Payer: Self-pay | Admitting: Obstetrics and Gynecology

## 2020-02-06 ENCOUNTER — Telehealth: Payer: Self-pay | Admitting: Family Medicine

## 2020-02-06 NOTE — Telephone Encounter (Signed)
Called the patient with the in office interpreter Eda. Informed of the upcoming appointment. The patient stated she can only come in on Friday's. Dr. Tarri Glenn was not available on Friday, the patient stated she can come on Thursday, verbalized understanding of the date and time. Mailed an appointment letter with the information.

## 2020-03-24 ENCOUNTER — Encounter: Payer: Self-pay | Admitting: Obstetrics and Gynecology

## 2020-03-27 ENCOUNTER — Other Ambulatory Visit: Payer: Self-pay

## 2020-03-27 ENCOUNTER — Encounter: Payer: Self-pay | Admitting: Obstetrics and Gynecology

## 2020-03-27 ENCOUNTER — Ambulatory Visit (INDEPENDENT_AMBULATORY_CARE_PROVIDER_SITE_OTHER): Payer: Self-pay | Admitting: Obstetrics and Gynecology

## 2020-03-27 ENCOUNTER — Other Ambulatory Visit (HOSPITAL_COMMUNITY)
Admission: RE | Admit: 2020-03-27 | Discharge: 2020-03-27 | Disposition: A | Discharge status: Home / Self Care | Payer: No Typology Code available for payment source | Source: Ambulatory Visit | Attending: Obstetrics and Gynecology | Admitting: Obstetrics and Gynecology

## 2020-03-27 VITALS — BP 109/77 | Ht <= 58 in

## 2020-03-27 DIAGNOSIS — R87810 Cervical high risk human papillomavirus (HPV) DNA test positive: Secondary | ICD-10-CM

## 2020-03-27 DIAGNOSIS — Z3202 Encounter for pregnancy test, result negative: Secondary | ICD-10-CM

## 2020-03-27 DIAGNOSIS — N871 Moderate cervical dysplasia: Secondary | ICD-10-CM

## 2020-03-27 HISTORY — PX: COLPOSCOPY: PRO47

## 2020-03-27 LAB — POCT PREGNANCY, URINE: Preg Test, Ur: NEGATIVE

## 2020-03-27 NOTE — Procedures (Signed)
Colposcopy Procedure Note  Pre-operative Diagnosis: 08/2019 negative pap but +HPV 16 but 18/45 negative  Post-operative Diagnosis: CIN 1  Procedure Details  UPT negative   The risks (including infection, bleeding, pain) and benefits of the procedure were explained to the patient and written informed consent was obtained.  The patient was placed in the dorsal lithotomy position. A Pederson was speculum inserted in the vagina, and the cervix was visualized.  AA staining done Lugol's with green filter.  Biopsy from 12 o'clock and a small cervical polyp was removed and then single toothed tenaculum applied and ECC in all four quadrants done. No bleeding after procedure  Findings: small AWE changes at the os. subcm endocervical polyp at the ext os seen and this was removed with the tischler forceps  Adequate: no  Specimens: 12 o'clock and small endocervical polyp; ECC  Condition: Stable  Complications: None  Plan: The patient was advised to call for any fever or for prolonged or severe pain or bleeding. She was advised to use OTC analgesics as needed for mild to moderate pain. She was advised to avoid vaginal intercourse for 1 week   [place] Bing, Montez Hageman MD Attending Center for [employer] [facility])

## 2020-03-28 ENCOUNTER — Other Ambulatory Visit: Payer: Self-pay

## 2020-04-01 LAB — SURGICAL PATHOLOGY

## 2020-04-03 ENCOUNTER — Telehealth (INDEPENDENT_AMBULATORY_CARE_PROVIDER_SITE_OTHER): Payer: Self-pay | Admitting: Lactation Services

## 2020-04-03 DIAGNOSIS — R87810 Cervical high risk human papillomavirus (HPV) DNA test positive: Secondary | ICD-10-CM

## 2020-04-03 NOTE — Telephone Encounter (Signed)
Attempted to call patient with assistance of Eda Royal, Spanish Interpreter to inform her of her Colpo results and recommendation of LEEP with the next 4-6 weeks with Dr. Vergie Living.   Patient did not answer. LM for patient to call the office for results.

## 2020-04-03 NOTE — Telephone Encounter (Signed)
-----   Message from [place] Bing, MD sent at 04/03/2020 12:39 PM EDT ----- Can you let her know that based on her colposcopy, I recommend she have a LEEP procedure in the office and get her scheduled with me for sometime in the next 4-6 weeks? thanks

## 2020-04-09 NOTE — Telephone Encounter (Signed)
Called patient with assistance of [employer] Spanish Interpreter # [phone].   Patient did not answer, message left for her to call the office.   Called sons number in the chart and no answer. LM to have patient call the office.

## 2020-04-09 NOTE — Telephone Encounter (Signed)
Called patient again with assistance of Jari Favre # [phone].   Informed patient that I was calling her with results of Colposcopy. Patient was informed that she needs to come in for  LEEP procedure in the office in the next 4-6 weeks. Patient informed front office will call to schedule an appointment for her. Patient voiced understanding.   Patient with several questions and concerns that were answered.   Patient reports she cannot always answer her phone and requested a text. Informed her we do not text. She reports she can check her voice messages. Message to front desk to call and schedule patient for LEEP.

## 2020-04-11 ENCOUNTER — Ambulatory Visit: Payer: Self-pay | Admitting: Internal Medicine

## 2020-04-11 ENCOUNTER — Telehealth: Payer: Self-pay

## 2020-04-11 ENCOUNTER — Ambulatory Visit: Payer: No Typology Code available for payment source | Admitting: Internal Medicine

## 2020-04-11 NOTE — Telephone Encounter (Signed)
Via Delorise Royals, Interpreter, Patient is not a [place] citizen and is not eligible for Medicaid to cover LEEP procedure. Patient states she was not aware that she needed a LEEP. Patient to contact [facility] for more information.

## 2020-04-11 NOTE — Telephone Encounter (Signed)
Financial assistance application mailed to patient

## 2020-05-30 ENCOUNTER — Ambulatory Visit (INDEPENDENT_AMBULATORY_CARE_PROVIDER_SITE_OTHER): Payer: Self-pay | Admitting: Obstetrics and Gynecology

## 2020-05-30 ENCOUNTER — Encounter: Payer: Self-pay | Admitting: Obstetrics and Gynecology

## 2020-05-30 ENCOUNTER — Encounter: Payer: Self-pay | Admitting: *Deleted

## 2020-05-30 ENCOUNTER — Other Ambulatory Visit: Payer: Self-pay

## 2020-05-30 ENCOUNTER — Other Ambulatory Visit (HOSPITAL_COMMUNITY)
Admission: RE | Admit: 2020-05-30 | Discharge: 2020-05-30 | Disposition: A | Discharge status: Home / Self Care | Payer: No Typology Code available for payment source | Source: Ambulatory Visit | Attending: Obstetrics and Gynecology | Admitting: Obstetrics and Gynecology

## 2020-05-30 VITALS — BP 101/65 | HR 79 | Wt 131.6 lb

## 2020-05-30 DIAGNOSIS — N871 Moderate cervical dysplasia: Secondary | ICD-10-CM

## 2020-05-30 DIAGNOSIS — R87810 Cervical high risk human papillomavirus (HPV) DNA test positive: Secondary | ICD-10-CM

## 2020-05-30 DIAGNOSIS — Z3202 Encounter for pregnancy test, result negative: Secondary | ICD-10-CM

## 2020-05-30 HISTORY — PX: LEEP: PRO1013

## 2020-05-30 LAB — POCT PREGNANCY, URINE: Preg Test, Ur: NEGATIVE

## 2020-05-30 NOTE — Procedures (Signed)
Loop Electrosurgical Excisional Procedure Note  Pre-operative Diagnosis: CIN 2 on colposcopy, negative ECC  Post-operative Diagnosis: CIN 2  Procedure Details  Urine pregnancy test: negative   The risks (including infection, bleeding, pain, preterm birth, cervical stenosis) and benefits of the procedure were explained to the patient and written informed consent was obtained.  The patient was placed in the dorsal lithotomy position. A coated Graves was speculum inserted in the vagina, and the cervix was visualized.  A colposcopy was performed with acetic acid and lugol's staining, with the below noted findings. The cervical stromal bed was injected with 61mL of 1% lidocaine with epinephrine. Entering at 12 o'clock on the cervix and using a large shallow Fischer Loop and a setting of 50/50 blend current, a LEEP was done, in one circumferential fashion.  Next, an ECC was done and hemostasis achieved with the ball electrode on 50 coagulation current and application of Monsel's. On coagulation, the entire LEEP bed and surgical margins were thoroughly cauterized  Findings: circumferential AWE changes  Adequate: Yes  Specimens: LEEP specimen (circumferential, 360 degrees) and ECC  Condition: Stable  Complications: None  Plan: The patient was advised to call for any fever or for prolonged or severe pain or bleeding. She was advised to use OTC analgesics as needed for mild to moderate pain. She was advised to do pelvic rest was advised until her four week post operative visit.   Interpreter used  [place] Bing, Montez Hageman MD Attending Center for [employer] [facility])

## 2020-05-30 NOTE — Progress Notes (Signed)
Patient tolerated procedure well ; but after procedure c/o dizzy and nausea. Had patient ly down. BP 117/77 PP 77. Allowed to lie down serveral minutes.. C/o felt sob earlier oxygen saturation 99%. Allowed to rest until she states she feels better. Denies nausea or dizziness. Allowed toleave with son.  Marcy Bogosian,RN

## 2020-06-02 ENCOUNTER — Ambulatory Visit: Payer: No Typology Code available for payment source | Admitting: Internal Medicine

## 2020-06-03 LAB — SURGICAL PATHOLOGY

## 2020-06-12 ENCOUNTER — Encounter: Payer: Self-pay | Admitting: Obstetrics and Gynecology

## 2020-06-13 ENCOUNTER — Telehealth (INDEPENDENT_AMBULATORY_CARE_PROVIDER_SITE_OTHER): Payer: Self-pay | Admitting: Lactation Services

## 2020-06-13 DIAGNOSIS — N879 Dysplasia of cervix uteri, unspecified: Secondary | ICD-10-CM

## 2020-06-13 NOTE — Telephone Encounter (Signed)
-----   Message from [place] Bing, MD sent at 06/12/2020  9:14 AM EDT ----- Can you let her know that she had some slightly abnormal cells, so I just recommend she repeat a pap smear and hpv testing in one year? Thanks!

## 2020-06-13 NOTE — Telephone Encounter (Signed)
Called patient with assistance of [employer], Melvenia Beam # [phone].   LM for patient to call the office for results.  Patient answered on the 2nd call.   Patient informed of results and to follow up in 1 year for a repeat Pap.   Patient voiced understanding to above.

## 2020-07-02 ENCOUNTER — Ambulatory Visit: Payer: No Typology Code available for payment source | Admitting: Obstetrics and Gynecology

## 2020-07-25 ENCOUNTER — Encounter: Payer: Self-pay | Admitting: General Practice

## 2020-10-15 IMAGING — MG DIGITAL SCREENING BILAT W/ TOMO W/ CAD
8 series · 9 of 24 positions shown · non-contrast
Comparison: Previous exam(s).

CLINICAL DATA: Screening.

EXAM:
DIGITAL SCREENING BILATERAL MAMMOGRAM WITH TOMO AND CAD

[R MLO synth-2D]
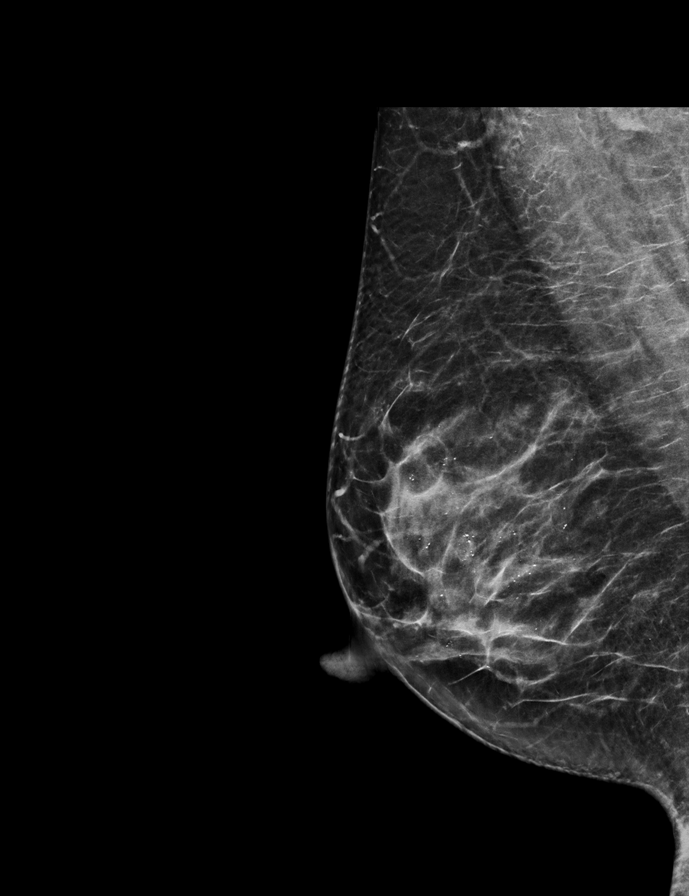

[R CC synth-2D]
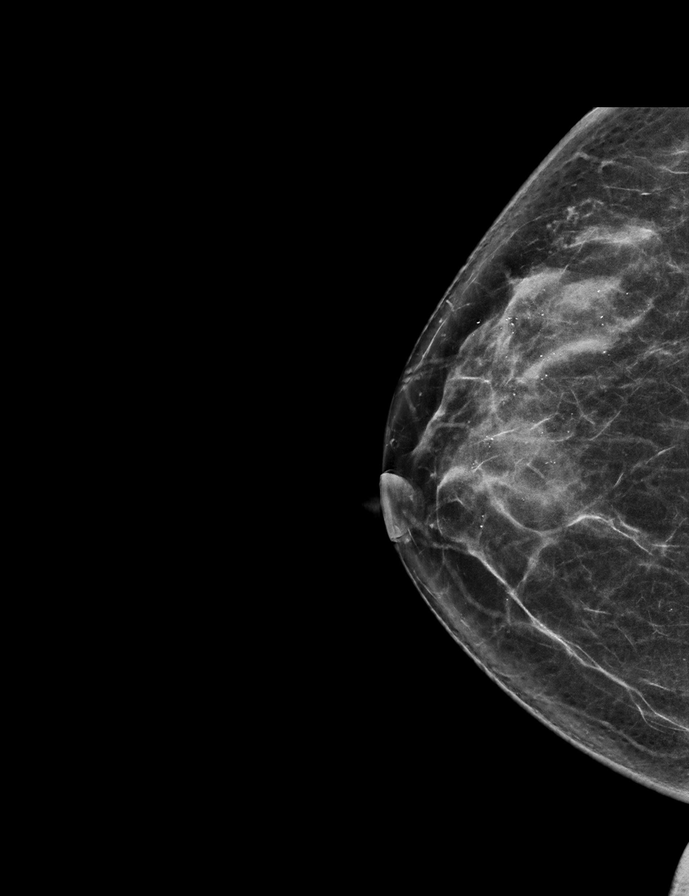

[L CC synth-2D]
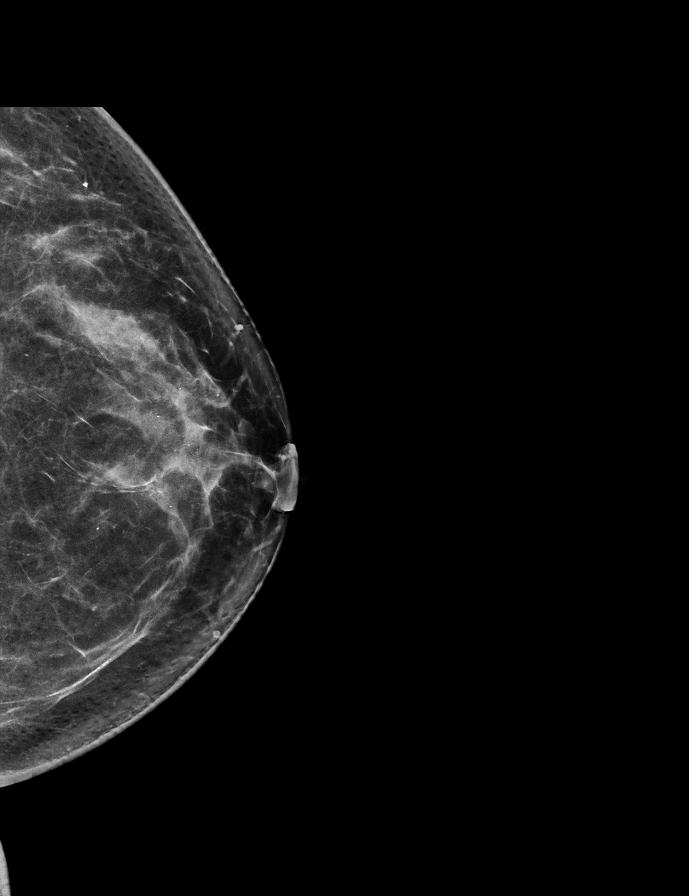

[L MLO synth-2D]
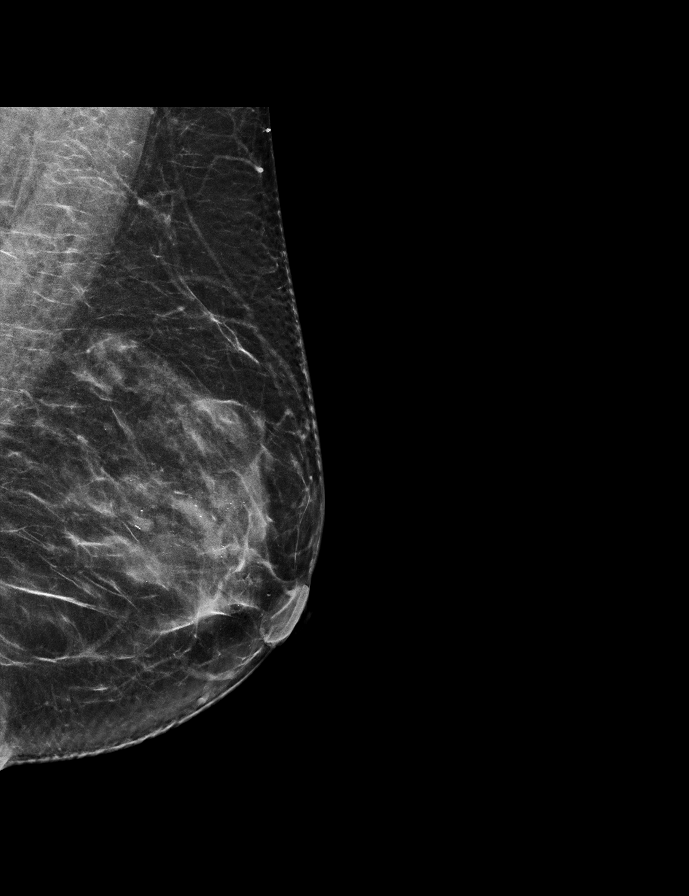

[R MLO tomo · 2 of 68 frames shown]
[frame 22/68]
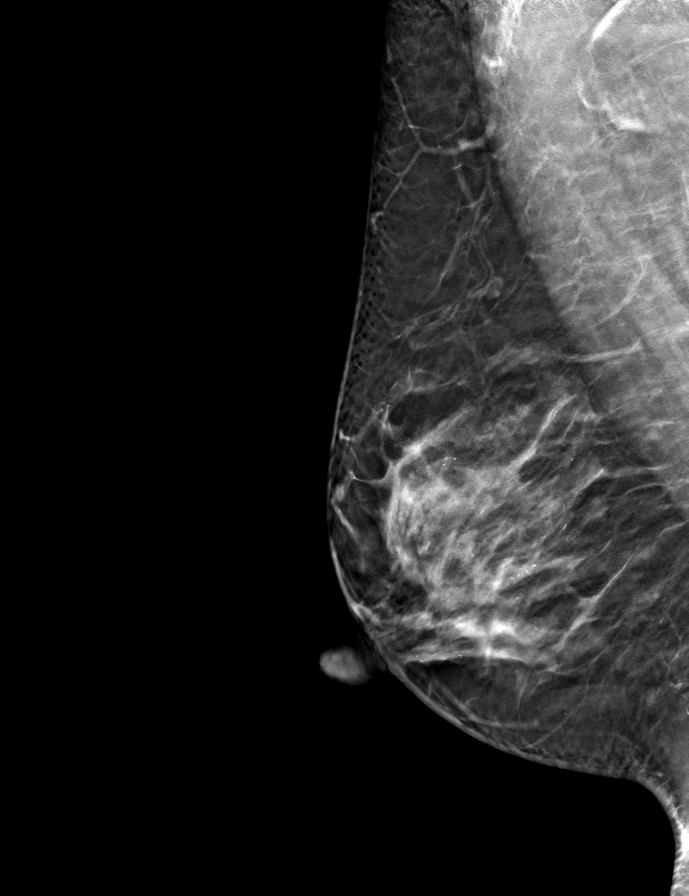
[frame 35/68]
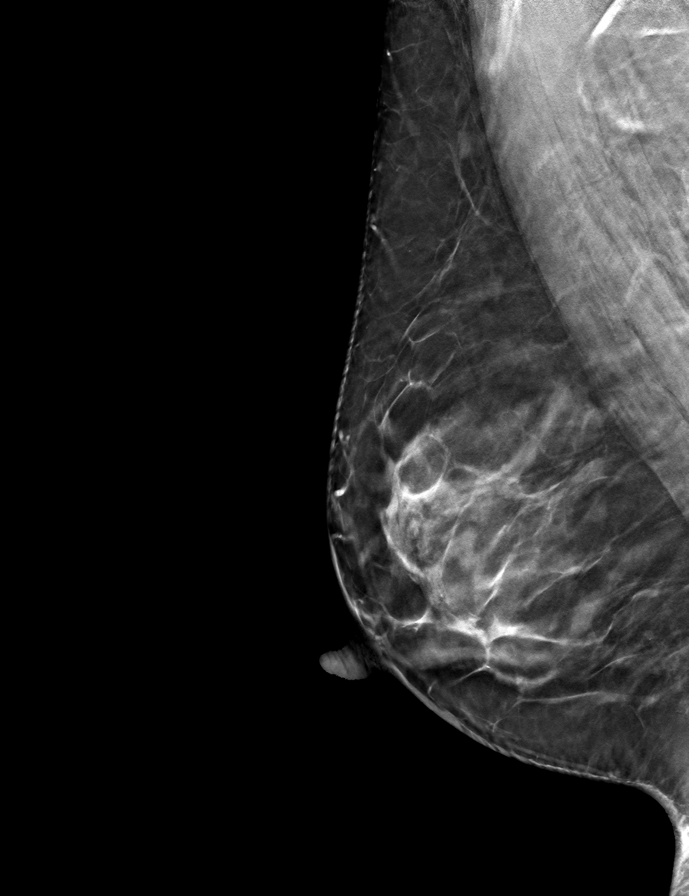

[R CC tomo · tomo slice 34/67.0]
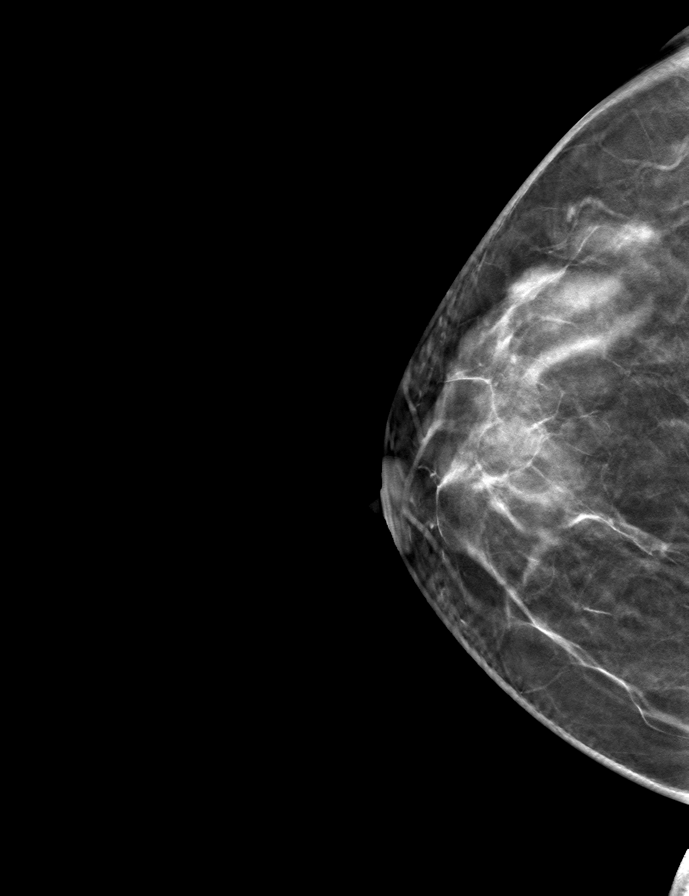

[L MLO tomo · tomo slice 33/65.0]
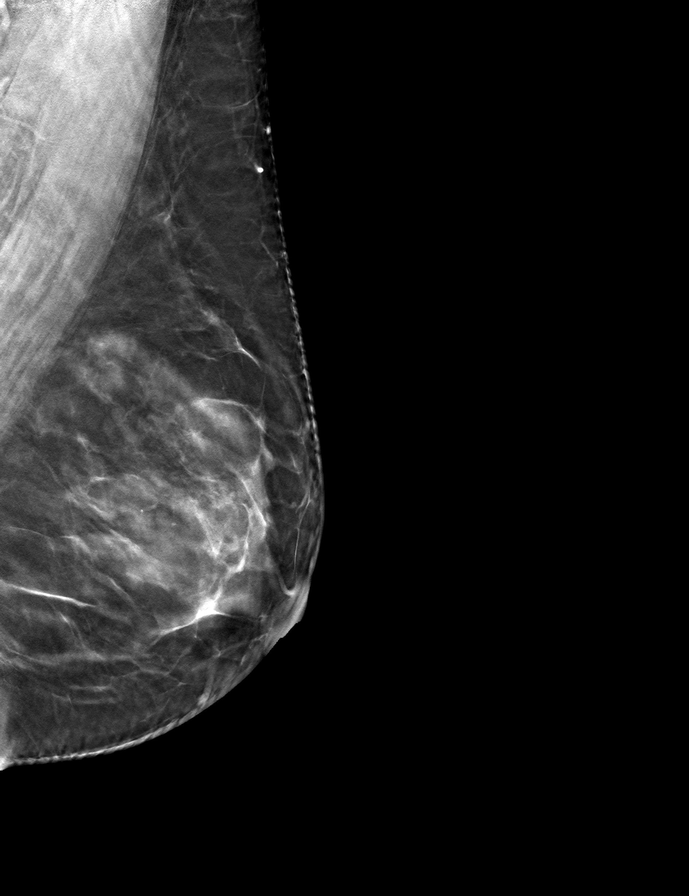

[L CC tomo · tomo slice 35/70.0]
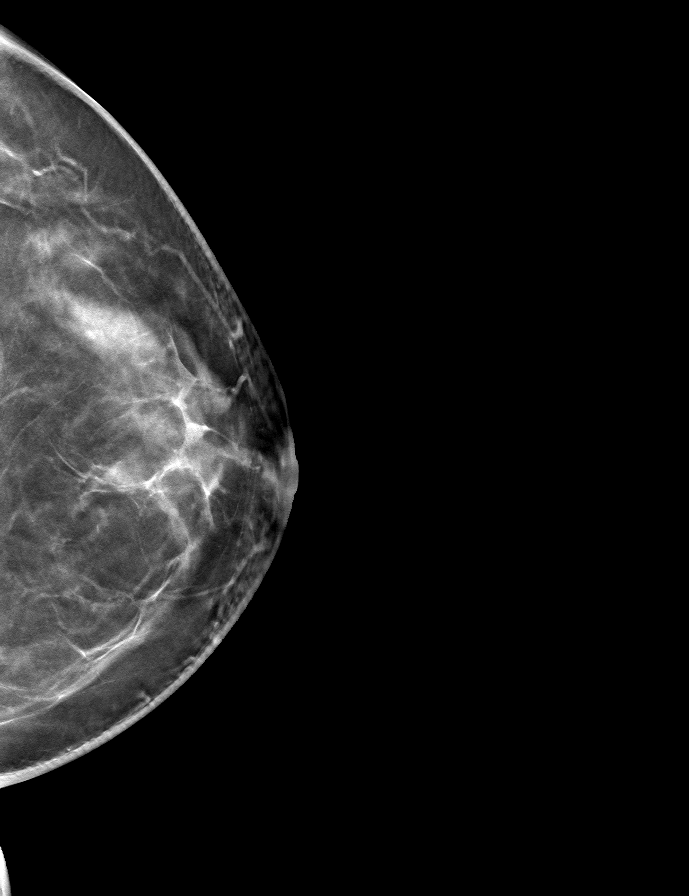

[9 of 24 positions shown; findings below may reference images not displayed]

ACR Breast Density Category c: The breast tissue is heterogeneously
dense, which may obscure small masses.
FINDINGS: There are no findings suspicious for malignancy. Images were
processed with CAD.
IMPRESSION: No mammographic evidence of malignancy. A result letter of this
screening mammogram will be mailed directly to the patient.

RECOMMENDATION:
Screening mammogram in one year. (Code:[id number])

BI-RADS CATEGORY  1: Negative.

## 2021-12-16 ENCOUNTER — Ambulatory Visit: Payer: Self-pay | Admitting: *Deleted

## 2021-12-16 ENCOUNTER — Telehealth: Payer: Self-pay | Admitting: Internal Medicine

## 2021-12-16 NOTE — Telephone Encounter (Signed)
Pt calling in using Spanish interpreter.   Reason for Disposition  [1] Face is swollen AND [2] no fever  Answer Assessment - Initial Assessment Questions 1. LOCATION: "Which tooth is hurting?"  (e.g., right-side/left-side, upper/lower, front/back)     I think I have a cavity.  My jaw is swollen.  She called the dentist but because she lives in [place] they can't see her.  (Called in using Spanish interpreter).   The dentist in [facility] told me to call a dentist in [place].   She is calling [facility] thinking it's a Training and development officer.     I referred her to [facility] on [address] since they will see pts on a walk in basis and if they don't have insurance.   I gave her the address and phone number.   She was agreeable to contacting them. 2. ONSET: "When did the toothache start?"  (e.g., hours, days)       3. SEVERITY: "How bad is the toothache?"  (Scale 1-10; mild, moderate or severe)   - MILD (1-3): doesn't interfere with chewing    - MODERATE (4-7): interferes with chewing, interferes with normal activities, awakens from sleep     - SEVERE (8-10): unable to eat, unable to do any normal activities, excruciating pain        *No Answer* 4. SWELLING: "Is there any visible swelling of your face?"     *No Answer* 5. OTHER SYMPTOMS: "Do you have any other symptoms?" (e.g., fever)     *No Answer* 6. PREGNANCY: "Is there any chance you are pregnant?" "When was your last menstrual period?"     *No Answer*  Protocols used: Toothache-A-AH

## 2021-12-16 NOTE — Telephone Encounter (Signed)
°  Chief Complaint: toothache Symptoms: jaw is swollen and painful Frequency: now Pertinent Negatives: Patient denies N/A Disposition: [] ED /[] Urgent Care (no appt availability in office) / [] Appointment(In office/virtual)/ []  [place] [facility]/ [] Home Care/ [] Refused Recommended Disposition /[] [place] [facility]/ []  Follow-up with PCP Additional Notes: I referred her to on .   Pt was agreeable to going there.   They see walk ins and people without insurance.

## 2021-12-16 NOTE — Telephone Encounter (Signed)
Referral Request - Has patient seen PCP for this complaint? no *If NO, is insurance requiring patient see PCP for this issue before PCP can refer them? Referral for which specialty:dentist Preferred provider/office:[facility] Reason for referral:molar inflammed and jaw hurts
